# Patient Record
Sex: Female | Born: 1985 | Race: White | Hispanic: No | Marital: Married | Smoking: Never smoker
Health system: Southern US, Community
[De-identification: ages and names within clinical notes are randomized; demographics above are authoritative.]

## PROBLEM LIST (undated history)

## (undated) DIAGNOSIS — J302 Other seasonal allergic rhinitis: Secondary | ICD-10-CM

## (undated) HISTORY — DX: Other seasonal allergic rhinitis: J30.2

## (undated) HISTORY — PX: WISDOM TOOTH EXTRACTION: SHX21

---
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
## 2011-11-26 ENCOUNTER — Emergency Department (HOSPITAL_COMMUNITY)
Admission: EM | Admit: 2011-11-26 | Discharge: 2011-11-26 | Disposition: A | Discharge status: Home / Self Care | Payer: BC Managed Care – PPO | Attending: Emergency Medicine | Admitting: Emergency Medicine

## 2011-11-26 ENCOUNTER — Encounter (HOSPITAL_COMMUNITY): Payer: Self-pay | Admitting: *Deleted

## 2011-11-26 DIAGNOSIS — R109 Unspecified abdominal pain: Secondary | ICD-10-CM

## 2011-11-26 DIAGNOSIS — R1013 Epigastric pain: Secondary | ICD-10-CM | POA: Insufficient documentation

## 2011-11-26 LAB — URINE MICROSCOPIC-ADD ON

## 2011-11-26 LAB — URINALYSIS, ROUTINE W REFLEX MICROSCOPIC
Glucose, UA: NEGATIVE mg/dL
Ketones, ur: NEGATIVE mg/dL
Protein, ur: 30 mg/dL — AB
pH: 6.5 (ref 5.0–8.0)

## 2011-11-26 LAB — CBC
HCT: 40.1 % (ref 36.0–46.0)
Hemoglobin: 14 g/dL (ref 12.0–15.0)
MCV: 85.9 fL (ref 78.0–100.0)
WBC: 10.1 10*3/uL (ref 4.0–10.5)

## 2011-11-26 LAB — COMPREHENSIVE METABOLIC PANEL
AST: 18 U/L (ref 0–37)
Albumin: 4.3 g/dL (ref 3.5–5.2)
BUN: 12 mg/dL (ref 6–23)
Creatinine, Ser: 0.72 mg/dL (ref 0.50–1.10)
Potassium: 3.7 mEq/L (ref 3.5–5.1)
Total Protein: 7.8 g/dL (ref 6.0–8.3)

## 2011-11-26 LAB — DIFFERENTIAL
Lymphocytes Relative: 27 % (ref 12–46)
Lymphs Abs: 2.8 10*3/uL (ref 0.7–4.0)
Monocytes Absolute: 0.6 10*3/uL (ref 0.1–1.0)
Monocytes Relative: 6 % (ref 3–12)
Neutro Abs: 6.3 10*3/uL (ref 1.7–7.7)

## 2011-11-26 LAB — POCT PREGNANCY, URINE: Preg Test, Ur: NEGATIVE

## 2011-11-26 LAB — LIPASE, BLOOD: Lipase: 46 U/L (ref 11–59)

## 2011-11-26 MED ORDER — CIPROFLOXACIN HCL 250 MG PO TABS: 250.0000 mg | ORAL_TABLET | Freq: Two times a day (BID) | ORAL | Status: AC

## 2011-11-26 NOTE — ED Provider Notes (Signed)
I saw and evaluated the patient, reviewed the resident's note and I agree with the findings and plan.  Abd SNT had transient pain sent to ED for labs. Initial U/A could be contaminated so will cath.  Hurman Horn, MD 11/27/11 [phone]

## 2011-11-26 NOTE — ED Notes (Signed)
Patient states she is on her period.  She woke up today with cramping pain that wraps around her abd into her back.  Patient denies any changes in her period.

## 2011-11-26 NOTE — ED Notes (Signed)
Patient states on second day of menstrual cycle usually has cramping but never this bad.  Seen at urgent care all test negative.  Airway intact bilateral equal chest rise and fall steady gait. Denies dysuria

## 2011-11-26 NOTE — ED Provider Notes (Signed)
History     CSN: [id number]  Arrival date & time 11/26/11  1414   First MD Initiated Contact with Patient 11/26/11 1745      Chief Complaint  Patient presents with  . Abdominal Pain    (Consider location/radiation/quality/duration/timing/severity/associated sxs/prior treatment) Patient is a 26 y.o. female presenting with abdominal pain. The history is provided by the patient.  Abdominal Pain The primary symptoms of the illness include abdominal pain. The primary symptoms of the illness do not include fever, fatigue, shortness of breath, nausea, vomiting, diarrhea or vaginal discharge. The current episode started 13 to 24 hours ago. The onset of the illness was sudden. The problem has been resolved.  The abdominal pain is located in the epigastric region. The abdominal pain does not radiate. The abdominal pain is relieved by nothing. Exacerbated by: nothing.  Associated with: nothing. The patient states that she believes she is currently not pregnant. The patient has not had a change in bowel habit. Symptoms associated with the illness do not include chills, diaphoresis, hematuria or back pain. Associated medical issues comments: none.    History reviewed. No pertinent past medical history.  Past Surgical History  Procedure Date  . Wisdom tooth extraction     No family history on file.  History  Substance Use Topics  . Smoking status: Never Smoker   . Smokeless tobacco: Not on file  . Alcohol Use: No    OB History    Grav Para Term Preterm Abortions TAB SAB Ect Mult Living                  Review of Systems  Constitutional: Negative for fever, chills, diaphoresis and fatigue.  HENT: Negative for ear pain, congestion, sore throat, facial swelling, mouth sores, trouble swallowing, neck pain and neck stiffness.   Eyes: Negative.   Respiratory: Negative for apnea, cough, chest tightness, shortness of breath and wheezing.   Cardiovascular: Negative for chest pain,  palpitations and leg swelling.  Gastrointestinal: Positive for abdominal pain. Negative for nausea, vomiting, diarrhea and abdominal distention.  Genitourinary: Negative for hematuria, flank pain, vaginal discharge, difficulty urinating and menstrual problem.  Musculoskeletal: Negative for back pain and gait problem.  Skin: Negative for rash and wound.  Neurological: Negative for dizziness, tremors, seizures, syncope, facial asymmetry, numbness and headaches.  Psychiatric/Behavioral: Negative.   All other systems reviewed and are negative.    Allergies  Review of patient's allergies indicates no known allergies.  Home Medications   Current Outpatient Rx  Name Route Sig Dispense Refill  . MIDOL COMPLETE PO Oral Take 2 tablets by mouth every 6 (six) hours as needed. Pain      BP 117/81  Pulse 86  Temp 99 F (37.2 C) (Oral)  Resp 18  Ht 5\' 7"  (1.702 m)  Wt 237 lb (107.502 kg)  BMI 37.12 kg/m2  SpO2 99%  Physical Exam  Nursing note and vitals reviewed. Constitutional: She is oriented to person, place, and time. She appears well-developed and well-nourished. No distress.  HENT:  Head: Normocephalic and atraumatic.  Right Ear: External ear normal.  Left Ear: External ear normal.  Nose: Nose normal.  Mouth/Throat: Oropharynx is clear and moist. No oropharyngeal exudate.  Eyes: Conjunctivae and EOM are normal. Pupils are equal, round, and reactive to light. Right eye exhibits no discharge. Left eye exhibits no discharge.  Neck: Normal range of motion. Neck supple. No JVD present. No tracheal deviation present. No thyromegaly present.  Cardiovascular: Normal rate, regular rhythm,  normal heart sounds and intact distal pulses.  Exam reveals no gallop and no friction rub.   No murmur heard. Pulmonary/Chest: Effort normal and breath sounds normal. No respiratory distress. She has no wheezes. She has no rales. She exhibits no tenderness.  Abdominal: Soft. Bowel sounds are normal. She  exhibits no distension. There is no tenderness. There is no rebound and no guarding.  Genitourinary: Uterus normal. Uterus is not deviated and not tender. Cervix exhibits no motion tenderness, no discharge and no friability. Right adnexum displays no mass, no tenderness and no fullness. Left adnexum displays no mass, no tenderness and no fullness. There is bleeding around the vagina. No erythema or tenderness around the vagina. No foreign body around the vagina. No signs of injury around the vagina. No vaginal discharge found.  Musculoskeletal: Normal range of motion.  Lymphadenopathy:    She has no cervical adenopathy.  Neurological: She is alert and oriented to person, place, and time. No cranial nerve deficit. Coordination normal.  Skin: Skin is warm. No rash noted. She is not diaphoretic.  Psychiatric: She has a normal mood and affect. Her behavior is normal. Judgment and thought content normal.    ED Course  Procedures (including critical care time)  Labs Reviewed  URINALYSIS, ROUTINE W REFLEX MICROSCOPIC - Abnormal; Notable for the following:    Color, Urine RED (*)  BIOCHEMICALS MAY BE AFFECTED BY COLOR   APPearance CLOUDY (*)     Hgb urine dipstick LARGE (*)     Protein, ur 30 (*)     Leukocytes, UA MODERATE (*)     All other components within normal limits  URINE MICROSCOPIC-ADD ON - Abnormal; Notable for the following:    Squamous Epithelial / LPF FEW (*)     Bacteria, UA FEW (*)     All other components within normal limits  POCT PREGNANCY, URINE  COMPREHENSIVE METABOLIC PANEL  LIPASE, BLOOD  CBC  DIFFERENTIAL  URINALYSIS, ROUTINE W REFLEX MICROSCOPIC   No results found.   1. Abdominal pain       MDM  26 year old female patient with noncontributory past medical history comes in complaining of abdominal pain. Patient says that approximately 12 hours before presentation she was awoken from sleep with epigastric pain that lasted about 10 minutes and then resolved on  its own. Patient then went to urgent care clinic where they did urine testing and exam the patient and felt she was well and discharge her home. Patient had recurrence of the pain but was much less intense after going home. Patient was advised to come to the emergency department for examination. Here patient's abdomen is soft nontender nondistended no peritonitis no nausea vomiting no diarrhea no recent illnesses patient is on her period. Patient has been sexually active with with her husband exclusively for the past year. No vaginal symptoms. Normal bleeding of 6 days with regular cycles. Bimanual exam showed no cervical motion tenderness or adnexal tenderness there was blood on the glove but no discharge. Urinalysis with possible signs of infection patient was unable tolerate and out catheterization will treat empirically for cystitis but otherwise patient appears well-hydrated for discharge. Patient screening abdominal labs are negative.   Results for orders placed during the hospital encounter of 11/26/11  URINALYSIS, ROUTINE W REFLEX MICROSCOPIC      Component Value Range   Color, Urine RED (*) YELLOW   APPearance CLOUDY (*) CLEAR   Specific Gravity, Urine 1.016  1.005 - 1.030   pH 6.5  5.0 - 8.0   Glucose, UA NEGATIVE  NEGATIVE mg/dL   Hgb urine dipstick LARGE (*) NEGATIVE   Bilirubin Urine NEGATIVE  NEGATIVE   Ketones, ur NEGATIVE  NEGATIVE mg/dL   Protein, ur 30 (*) NEGATIVE mg/dL   Urobilinogen, UA 0.2  0.0 - 1.0 mg/dL   Nitrite NEGATIVE  NEGATIVE   Leukocytes, UA MODERATE (*) NEGATIVE  POCT PREGNANCY, URINE      Component Value Range   Preg Test, Ur NEGATIVE  NEGATIVE  URINE MICROSCOPIC-ADD ON      Component Value Range   Squamous Epithelial / LPF FEW (*) RARE   WBC, UA 11-20  <3 WBC/hpf   RBC / HPF TOO NUMEROUS TO COUNT  <3 RBC/hpf   Bacteria, UA FEW (*) RARE  COMPREHENSIVE METABOLIC PANEL      Component Value Range   Sodium 140  135 - 145 mEq/L   Potassium 3.7  3.5 - 5.1  mEq/L   Chloride 103  96 - 112 mEq/L   CO2 25  19 - 32 mEq/L   Glucose, Bld 89  70 - 99 mg/dL   BUN 12  6 - 23 mg/dL   Creatinine, Ser 4.54  0.50 - 1.10 mg/dL   Calcium 9.3  8.4 - 09.8 mg/dL   Total Protein 7.8  6.0 - 8.3 g/dL   Albumin 4.3  3.5 - 5.2 g/dL   AST 18  0 - 37 U/L   ALT 19  0 - 35 U/L   Alkaline Phosphatase 78  39 - 117 U/L   Total Bilirubin 0.3  0.3 - 1.2 mg/dL   GFR calc non Af Amer >90  >90 mL/min   GFR calc Af Amer >90  >90 mL/min  LIPASE, BLOOD      Component Value Range   Lipase 46  11 - 59 U/L  CBC      Component Value Range   WBC 10.1  4.0 - 10.5 K/uL   RBC 4.67  3.87 - 5.11 MIL/uL   Hemoglobin 14.0  12.0 - 15.0 g/dL   HCT 11.9  14.7 - 82.9 %   MCV 85.9  78.0 - 100.0 fL   MCH 30.0  26.0 - 34.0 pg   MCHC 34.9  30.0 - 36.0 g/dL   RDW 56.2  13.0 - 86.5 %   Platelets 268  150 - 400 K/uL  DIFFERENTIAL      Component Value Range   Neutrophils Relative 62  43 - 77 %   Neutro Abs 6.3  1.7 - 7.7 K/uL   Lymphocytes Relative 27  12 - 46 %   Lymphs Abs 2.8  0.7 - 4.0 K/uL   Monocytes Relative 6  3 - 12 %   Monocytes Absolute 0.6  0.1 - 1.0 K/uL   Eosinophils Relative 5  0 - 5 %   Eosinophils Absolute 0.5  0.0 - 0.7 K/uL   Basophils Relative 0  0 - 1 %   Basophils Absolute 0.0  0.0 - 0.1 K/uL     Is discussed with Dr. Burnett Corrente, MD 11/26/11 [phone]

## 2011-11-26 NOTE — ED Notes (Signed)
Received report from [employer]. Pt has been having lower abdominal pain that radiates to her back since 0400 this AM. Pt is on day 2 of her menstrual cycle. Pt stated that she normally has cramping but not this bad. Bleeding is at baseline. No n/v. Will continue to monitor.

## 2011-11-26 NOTE — ED Notes (Signed)
Attempted to to In and Out Cath x 3. pt and was unsuccessful. EDP made aware. Will continue to monitor.

## 2013-04-06 ENCOUNTER — Encounter: Payer: Self-pay | Admitting: Family Medicine

## 2013-05-11 ENCOUNTER — Encounter: Payer: Self-pay | Admitting: Family Medicine

## 2013-05-11 ENCOUNTER — Ambulatory Visit (INDEPENDENT_AMBULATORY_CARE_PROVIDER_SITE_OTHER): Payer: BC Managed Care – PPO | Admitting: Family Medicine

## 2013-05-11 VITALS — BP 122/92 | HR 73 | Temp 97.8°F | Resp 16 | Ht 67.0 in | Wt 249.8 lb

## 2013-05-11 DIAGNOSIS — E669 Obesity, unspecified: Secondary | ICD-10-CM | POA: Insufficient documentation

## 2013-05-11 DIAGNOSIS — Z01419 Encounter for gynecological examination (general) (routine) without abnormal findings: Secondary | ICD-10-CM

## 2013-05-11 DIAGNOSIS — Z8742 Personal history of other diseases of the female genital tract: Secondary | ICD-10-CM

## 2013-05-11 DIAGNOSIS — Z124 Encounter for screening for malignant neoplasm of cervix: Secondary | ICD-10-CM

## 2013-05-11 NOTE — Progress Notes (Signed)
S: This 27 y.o. Cauc married female is here for 1st PAP/pelvic exam. She was treated for yeast vaginitis a few weeks ago. The discharge has cleared; she denies pelvic pain or dysuria or abnormal menstrual bleeding. Cycle is every "4 weeks" and bleeding lasts ~6 days. Pt is sexually active but not interested in birth control. She and her husband are not trying to conceive.  PMHx, Surg Hx , Soc and Fam Hx reviewed.  ROS: As per HPI; otherwise unremarkable. Pt is aware that she needs to lose weight; she and her husband plan to address this issue.  O: Filed Vitals:   05/11/13 1343  BP: 122/92  Pulse: 73  Temp: 97.8 F (36.6 C)  Resp: 16   GEN: In NAD; WN,WD. HENT: [place]/AT; EOMI w/ clear conj/sclerae. Otherwise unremarkable. COR: RRR. LUNGS: Normal resp rate and effort. SKIN: W&D; intact w/o erythema or pallor. GU: NEFG w/ erythema and swelling of labia; vaginal vault w/ milky discharge and walls are erythematous. Cervix- nonparous w/ erythema; PAP obtained and bleeding from friable os noted. Bimanual- Midline uterus; no adnexal masses but mildly tender. NEURO: A&O x 3; CNs intact. Nonfocal.  A/P: Encounter for cervical Pap smear with pelvic exam - Plan: Pap IG, CT/NG w/ reflex HPV when ASC-U  H/O vaginal discharge

## 2013-05-14 NOTE — Progress Notes (Signed)
Quick Note:  Notify pt of Normal results. ______ 

## 2013-05-15 LAB — PAP IG, CT-NG, RFX HPV ASCU
Chlamydia Probe Amp: NEGATIVE
GC Probe Amp: NEGATIVE

## 2013-05-15 NOTE — Progress Notes (Signed)
Quick Note:  Notify pt of Normal results. ______ 

## 2014-03-05 ENCOUNTER — Other Ambulatory Visit: Payer: Self-pay | Admitting: Physician Assistant

## 2014-03-05 DIAGNOSIS — N63 Unspecified lump in unspecified breast: Secondary | ICD-10-CM

## 2014-03-07 ENCOUNTER — Other Ambulatory Visit: Payer: BC Managed Care – PPO

## 2014-03-11 ENCOUNTER — Ambulatory Visit
Admission: RE | Admit: 2014-03-11 | Discharge: 2014-03-11 | Disposition: A | Discharge status: Home / Self Care | Payer: BC Managed Care – PPO | Source: Ambulatory Visit | Attending: Physician Assistant | Admitting: Physician Assistant

## 2014-03-11 DIAGNOSIS — N63 Unspecified lump in unspecified breast: Secondary | ICD-10-CM

## 2015-10-24 ENCOUNTER — Ambulatory Visit (INDEPENDENT_AMBULATORY_CARE_PROVIDER_SITE_OTHER): Payer: BLUE CROSS/BLUE SHIELD | Admitting: Physician Assistant

## 2015-10-24 VITALS — BP 110/77 | HR 72 | Temp 98.0°F | Resp 16 | Ht 67.0 in | Wt 249.0 lb

## 2015-10-24 DIAGNOSIS — Z139 Encounter for screening, unspecified: Secondary | ICD-10-CM | POA: Diagnosis not present

## 2015-10-24 DIAGNOSIS — J069 Acute upper respiratory infection, unspecified: Secondary | ICD-10-CM | POA: Diagnosis not present

## 2015-10-24 LAB — POCT URINE PREGNANCY: Preg Test, Ur: NEGATIVE

## 2015-10-24 MED ORDER — HYDROCODONE-HOMATROPINE 5-1.5 MG/5ML PO SYRP: 2.5000 mL | ORAL_SOLUTION | Freq: Every evening | ORAL | Status: DC | PRN

## 2015-10-24 NOTE — Patient Instructions (Addendum)
Take 400-600 mg of motrin every 8 hour for pain and discomfort.  Take Zyrtec-D 5/120 in the morning and night for the next five days.  Use cough syrup as needed.     IF you received an x-ray today, you will receive an [facility] from [facility]. Please contact [facility] at [phone] with questions or concerns regarding your invoice.   IF you received labwork today, you will receive an invoice from [facility]. Please contact [facility] at [phone] with questions or concerns regarding your invoice.   Our billing staff will not be able to assist you with questions regarding bills from these companies.  You will be contacted with the lab results as soon as they are available. The fastest way to get your results is to activate your My Chart account. Instructions are located on the last page of this paperwork. If you have not heard from us regarding the results in 2 weeks, please contact this office.

## 2015-10-24 NOTE — Progress Notes (Signed)
   10/24/2015 10:03 AM   DOB: 02-01-1986 / MRN: [id number]  SUBJECTIVE:  Destiny Skinner is a 30 y.o. female presenting for nasal congestion and cough that started 5 days ago.  Associates sore throat near the beginning of the illness however this has resolved.  Has tried some of her husband's cough syrup with good relief.  She denies a history of asthma and quit smoking 3 months ago.  She smoked sporadically for about 1 year.   She has No Known Allergies.   She  has a past medical history of Seasonal allergies.    She  reports that she has never smoked. She has never used smokeless tobacco. She reports that she does not drink alcohol or use illicit drugs. She  has no sexual activity history on file. The patient  has past surgical history that includes Wisdom tooth extraction and Tonsillectomy (1993).  Her family history includes Cancer in her paternal grandfather; Diabetes in her mother and paternal grandmother; Heart disease in her maternal grandfather and maternal grandmother; Hyperlipidemia in her father and mother; Hypertension in her father and mother.  Review of Systems  Constitutional: Negative for fever and chills.  Respiratory: Positive for cough. Negative for hemoptysis, shortness of breath and wheezing.   Cardiovascular: Negative for chest pain.  Gastrointestinal: Negative for nausea and vomiting.  Skin: Negative for rash.  Neurological: Negative for headaches.    Problem list and medications reviewed and updated by myself where necessary, and exist elsewhere in the encounter.   OBJECTIVE:  BP 110/77 mmHg  Pulse 72  Temp(Src) 98 F (36.7 C) (Oral)  Resp 16  Ht 5\' 7"  (1.702 m)  Wt 249 lb (112.946 kg)  BMI 38.99 kg/m2  SpO2 97%  LMP 10/07/2015 (Approximate)  Physical Exam  Constitutional: She is oriented to person, place, and time.  HENT:  Right Ear: External ear normal.  Left Ear: External ear normal.  Nose: Mucosal edema present. Right sinus exhibits no maxillary  sinus tenderness and no frontal sinus tenderness. Left sinus exhibits no maxillary sinus tenderness and no frontal sinus tenderness.  Mouth/Throat: Oropharynx is clear and moist. No oropharyngeal exudate.  Eyes: Conjunctivae are normal. Pupils are equal, round, and reactive to light.  Cardiovascular: Regular rhythm and normal heart sounds.   Pulmonary/Chest: Effort normal and breath sounds normal. She has no rales.  Neurological: She is alert and oriented to person, place, and time.  Skin: Skin is warm and dry. No rash noted. She is not diaphoretic. No erythema.  Psychiatric: Her behavior is normal.    No results found for this or any previous visit (from the past 72 hour(s)).  No results found.  ASSESSMENT AND PLAN  Trula OreChristina was seen today for nasal congestion, cough and hoarse.  Diagnoses and all orders for this visit:  Acute URI: Most likely viral.  AVS for guidance and medication.  -     HYDROcodone-homatropine (HYCODAN) 5-1.5 MG/5ML syrup; Take 2.5-5 mLs by mouth at bedtime as needed.  Screening -     POCT urine pregnancy    The patient was advised to call or return to clinic if she does not see an improvement in symptoms or to seek the care of the closest emergency department if she worsens with the above plan.   Deliah BostonMichael Duyen Beckom, MHS, PA-C [facility] 10/24/2015 10:03 AM

## 2015-11-18 ENCOUNTER — Ambulatory Visit (INDEPENDENT_AMBULATORY_CARE_PROVIDER_SITE_OTHER): Payer: BLUE CROSS/BLUE SHIELD | Admitting: Urgent Care

## 2015-11-18 VITALS — BP 116/72 | HR 78 | Temp 98.4°F | Resp 18 | Ht 67.0 in | Wt 248.0 lb

## 2015-11-18 DIAGNOSIS — H9191 Unspecified hearing loss, right ear: Secondary | ICD-10-CM

## 2015-11-18 DIAGNOSIS — H6121 Impacted cerumen, right ear: Secondary | ICD-10-CM | POA: Diagnosis not present

## 2015-11-18 NOTE — Patient Instructions (Addendum)
Cerumen Impaction The structures of the external ear canal secrete a waxy substance known as cerumen. Excess cerumen can build up in the ear canal, causing a condition known as cerumen impaction. Cerumen impaction can cause ear pain and disrupt the function of the ear. The rate of cerumen production differs for each individual. In certain individuals, the configuration of the ear canal may decrease his or her ability to naturally remove cerumen. CAUSES Cerumen impaction is caused by excessive cerumen production or buildup. RISK FACTORS  Frequent use of swabs to clean ears.  Having narrow ear canals.  Having eczema.  Being dehydrated. SIGNS AND SYMPTOMS  Diminished hearing.  Ear drainage.  Ear pain.  Ear itch. TREATMENT Treatment may involve:  Over-the-counter or prescription ear drops to soften the cerumen.  Removal of cerumen by a health care provider. This may be done with:  Irrigation with warm water. This is the most common method of removal.  Ear curettes and other instruments.  Surgery. This may be done in severe cases. HOME CARE INSTRUCTIONS  Take medicines only as directed by your health care provider.  Do not insert objects into the ear with the intent of cleaning the ear. PREVENTION  Do not insert objects into the ear, even with the intent of cleaning the ear. Removing cerumen as a part of normal hygiene is not necessary, and the use of swabs in the ear canal is not recommended.  Drink enough water to keep your urine clear or pale yellow.  Control your eczema if you have it. SEEK MEDICAL CARE IF:  You develop ear pain.  You develop bleeding from the ear.  The cerumen does not clear after you use ear drops as directed.   This information is not intended to replace advice given to you by your health care provider. Make sure you discuss any questions you have with your health care provider.   Document Released: 06/17/2004 Document Revised: 05/31/2014  Document Reviewed: 12/25/2014 Elsevier Interactive Patient Education 2016 [employer]     IF you received an x-ray today, you will receive an invoice from [facility]. Please contact [facility] at [phone] with questions or concerns regarding your invoice.   IF you received labwork today, you will receive an invoice from [facility]. Please contact [facility] at [phone] with questions or concerns regarding your invoice.   Our billing staff will not be able to assist you with questions regarding bills from these companies.  You will be contacted with the lab results as soon as they are available. The fastest way to get your results is to activate your My Chart account. Instructions are located on the last page of this paperwork. If you have not heard from us regarding the results in 2 weeks, please contact this office.      

## 2015-11-18 NOTE — Progress Notes (Signed)
    MRN: [id number] DOB: Jan 03, 1986  Subjective:   Destiny Skinner is a 30 y.o. female presenting for chief complaint of Otitis Externa  Reports ~1 week history of decreased hearing in right ear. Problem started after she water in her right ear while showering. She attempted to use a Q-tip to get the water out and clean and since has had decreased hearing. She also tried otc cerumenolytic. Admits history of cerumen impaction requiring ear lavage. Denies fever, drainage, tinnitus, pain.   Destiny Skinner has a current medication list which includes the following prescription(s): multivitamin and hydrocodone-homatropine. Also has No Known Allergies.  Destiny Skinner  has a past medical history of Seasonal allergies. Also  has past surgical history that includes Wisdom tooth extraction and Tonsillectomy (1993).  Objective:   Vitals: BP 116/72 mmHg  Pulse 78  Temp(Src) 98.4 F (36.9 C) (Oral)  Resp 18  Ht 5\' 7"  (1.702 m)  Wt 248 lb (112.492 kg)  BMI 38.83 kg/m2  SpO2 98%  LMP 10/31/2015  Physical Exam  Constitutional: She is oriented to person, place, and time. She appears well-developed and well-nourished.  HENT:  Right TM cerumen impacted, both intact bilaterally, no effusions or erythema. Nasal turbinates pink and moist, without sinus tenderness. Throat without oropharyngeal exudates, erythema or abscesses.  Cardiovascular: Normal rate.   Pulmonary/Chest: Effort normal.  Neurological: She is alert and oriented to person, place, and time.  Skin: Skin is warm and dry.   Assessment and Plan :   1. Cerumen impaction, right 2. Decreased hearing, right - Anticipatory guidance provided, rtc as needed.  Wallis BambergMario Shaya Altamura, PA-C [facility] [phone] 11/18/2015 11:54 AM

## 2016-04-23 ENCOUNTER — Ambulatory Visit (INDEPENDENT_AMBULATORY_CARE_PROVIDER_SITE_OTHER): Payer: BLUE CROSS/BLUE SHIELD

## 2016-04-23 ENCOUNTER — Ambulatory Visit (INDEPENDENT_AMBULATORY_CARE_PROVIDER_SITE_OTHER): Payer: BLUE CROSS/BLUE SHIELD | Admitting: Emergency Medicine

## 2016-04-23 VITALS — BP 130/90 | HR 80 | Temp 99.1°F | Resp 16 | Ht 67.0 in | Wt 246.4 lb

## 2016-04-23 DIAGNOSIS — M542 Cervicalgia: Secondary | ICD-10-CM

## 2016-04-23 DIAGNOSIS — N912 Amenorrhea, unspecified: Secondary | ICD-10-CM

## 2016-04-23 LAB — POCT URINE PREGNANCY: Preg Test, Ur: NEGATIVE

## 2016-04-23 MED ORDER — MELOXICAM 7.5 MG PO TABS: ORAL_TABLET | ORAL | 0 refills | Status: DC

## 2016-04-23 MED ORDER — CYCLOBENZAPRINE HCL 5 MG PO TABS: 5.0000 mg | ORAL_TABLET | Freq: Three times a day (TID) | ORAL | 1 refills | Status: DC | PRN

## 2016-04-23 NOTE — Progress Notes (Addendum)
Patient ID: Destiny Skinner, female   DOB: 1986/02/22, 30 y.o.   MRN: [id number]    By signing my name below, I, Essence Howell, attest that this documentation has been prepared under the direction and in the presence of Collene GobbleSteven A Marquavius Scaife, MD Electronically Signed: Charline BillsEssence Howell, ED Scribe 04/23/2016 at 3:41 PM.  Chief Complaint:  Chief Complaint  Patient presents with  . Neck Pain    x 2 days -NKI-   HPI: Destiny Skinner is a 30 y.o. female who [facility] complaining of gradually worsening left-sided neck pain for the past 2 days. Pt repots increased neck pain with rotating her head. She went to work earlier today but states that pain was so severe that she had to leave early. 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 Pt denies weakness or numbness radiating into her upper extremities. Pt's LNMP was approximately 04/01/16; she is not currently on birth control.   Past Medical History:  Diagnosis Date  . Seasonal allergies    Past Surgical History:  Procedure Laterality Date  . TONSILLECTOMY  1993  . WISDOM TOOTH EXTRACTION     Social History   Social History  . Marital status: Married    Spouse name: N/A  . Number of children: N/A  . Years of education: N/A   Social History Main Topics  . Smoking status: Never Smoker  . Smokeless tobacco: Never Used  . Alcohol use No  . Drug use: No  . Sexual activity: Not Asked   Other Topics Concern  . None   Social History Narrative  . None   Family History  Problem Relation Age of Onset  . Diabetes Mother   . Hyperlipidemia Mother   . Hypertension Mother   . Hyperlipidemia Father   . Hypertension Father   . Heart disease Maternal Grandmother   . Heart disease Maternal Grandfather   . Diabetes Paternal Grandmother   . Cancer Paternal Grandfather    No Known Allergies Prior to Admission medications   Medication Sig Start  Date End Date Taking? Authorizing Provider  Multiple Vitamin (MULTIVITAMIN) tablet Take 1 tablet by mouth daily.    Historical Provider, MD   ROS: The patient denies fevers, chills, night sweats, unintentional weight loss, chest pain, palpitations, wheezing, dyspnea on exertion, nausea, vomiting, abdominal pain, dysuria, hematuria, melena, numbness, weakness, or tingling. +neck pain  All other systems have been reviewed and were otherwise negative with the exception of those mentioned in the HPI and as above.    PHYSICAL EXAM: Vitals:   04/23/16 1507  BP: 130/90  Pulse: 80  Resp: 16  Temp: 99.1 F (37.3 C)   Body mass index is 38.59 kg/m.  General: Alert, no acute distress HEENT:  Normocephalic, atraumatic, oropharynx patent. Eye: Nonie HoyerOMI, [facility] Cardiovascular:  Regular rate and rhythm, no rubs murmurs or gallops. No Carotid bruits, radial pulse intact. No pedal edema.  Respiratory: Clear to auscultation bilaterally. No wheezes, rales, or rhonchi. No cyanosis, no use of accessory musculature Abdominal: No organomegaly, abdomen is soft and non-tender, positive bowel sounds. No masses. Musculoskeletal: Gait intact. No edema. Tender to the L side of the neck. Pain with twisting to the R. Motor strength 5/5. Reflexes are equal and symmetrical.  Skin: No rashes. Neurologic: Facial musculature symmetric. Psychiatric: Patient acts appropriately throughout our interaction. Lymphatic: No cervical or submandibular lymphadenopathy  LABS Results for  orders placed or performed in visit on 04/23/16  POCT urine pregnancy  Result Value Ref Range   Preg Test, Ur Negative Negative    : .EKG/Xray Primary read interpreted by [facility] Cle[facility]aub [facility] Dg Cervical Spine 2 Or 3 Views  Result Date: 04/23/2016 CLINICAL DATA:  Acute cervicalgia EXAM: CERVICAL SPINE - 2-3 VIEW COMPARISON:  None. FINDINGS: Frontal, lateral, and open-mouth odontoid images were obtained. There is no fracture or spondylolisthesis.  Prevertebral soft tissues and predental space regions are normal. The disc spaces appear normal. No erosive change. IMPRESSION: No fracture or spondylolisthesis.  No evident arthropathy. Electronically Signed   By: Bretta BangWilliam  Woodruff III M.D.   On: 04/23/2016 16:49    ASSESSMENT/PLAN: Patient placed out of work. She will alternate ice and heat to her neck. She will be on meloxicam 7.5 one to 2 tablets daily along with Flexeril.I personally performed the services described in this documentation, which was scribed in my presence. The recorded information has been reviewed and is accurate.    Gross sideeffects, risk and benefits, and alternatives of medications d/w patient. Patient is aware that all medications have potential sideeffects and we are unable to predict every sideeffect or drug-drug interaction that may occur.  Lesle ChrisSteven Cinzia Devos MD 04/23/2016 3:41 PM

## 2016-04-23 NOTE — Patient Instructions (Addendum)
IF you received an x-ray today, you will receive an invoice from [facility]. Please contact [facility] at [phone] with questions or concerns regarding your invoice.   IF you received labwork today, you will receive an invoice from [facility]. Please contact [facility] at [phone] with questions or concerns regarding your invoice.   Our billing staff will not be able to assist you with questions regarding bills from these companies.  You will be contacted with the lab results as soon as they are available. The fastest way to get your results is to activate your My Chart account. Instructions are located on the last page of this paperwork. If you have not heard from us regarding the results in 2 weeks, please contact this office.     Cervical strain Cervical Sprain A cervical sprain is a stretch or tear in the tissues that connect bones (ligaments) in the neck. Most neck (cervical) sprains get better in 4-6 weeks. Follow these instructions at home: If you have a neck collar:  Wear it as told by your doctor. Do not take off (do not remove) the collar unless your doctor says that this is safe.  Ask your doctor before adjusting your collar.  If you have long hair, keep it outside of the collar.  Ask your doctor if you may take off the collar for cleaning and bathing. If you may take off the collar:  Follow instructions from your doctor about how to take off the collar safely.  Clean the collar by wiping it with mild soap and water. Let it air-dry all the way.  If your collar has removable pads:  Take the pads out every 1-2 days.  Hand wash the pads with soap and water.  Let the pads air-dry all the way before you put them back in the collar. Do not dry them in a clothes dryer. Do not dry them with a hair dryer.  Check your skin under the collar for irritation or sores. If you see any, tell your doctor. Managing pain,  stiffness, and swelling  Use a cervical traction device, if told by your doctor.  If told, put heat on the affected area. Do this before exercises (physical therapy) or as often as told by your doctor. Use the heat source that your doctor recommends, such as a moist heat pack or a heating pad.  Place a towel between your skin and the heat source.  Leave the heat on for 20-30 minutes.  Take the heat off (remove the heat) if your skin turns bright red. This is very important if you cannot feel pain, heat, or cold. You may have a greater risk of getting burned.  Put ice on the affected area.  Put ice in a plastic bag.  Place a towel between your skin and the bag.  Leave the ice on for 20 minutes, 2-3 times a day. Activity  Do not drive while wearing a neck collar. If you do not have a neck collar, ask your doctor if it is safe to drive.  Do not drive or use heavy machinery while taking prescription pain medicine or muscle relaxants, unless your doctor approves.  Do not lift anything that is heavier than 10 lb (4.5 kg) until your doctor tells you that it is safe.  Rest as told by your doctor.  Avoid activities that make you feel worse. Ask your doctor what activities are safe for you.  Do exercises as told by  your doctor or physical therapist. Preventing neck sprain  Practice good posture. Adjust your workstation to help with this, if needed.  Exercise regularly as told by your doctor or physical therapist.  Avoid activities that are risky or may cause a neck sprain (cervical sprain). General instructions  Take over-the-counter and prescription medicines only as told by your doctor.  Do not use any products that contain nicotine or tobacco. This includes cigarettes and e-cigarettes. If you need help quitting, ask your doctor.  Keep all follow-up visits as told by your doctor. This is important. Contact a doctor if:  You have pain or other symptoms that get worse.  You  have symptoms that do not get better after 2 weeks.  You have pain that does not get better with medicine.  You start to have new, unexplained symptoms.  You have sores or irritated skin from wearing your neck collar. Get help right away if:  You have very bad pain.  You have any of the following in any part of your body:  Loss of feeling (numbness).  Tingling.  Weakness.  You cannot move a part of your body (you have paralysis).  Your activity level does not improve. Summary  A cervical sprain is a stretch or tear in the tissues that connect bones (ligaments) in the neck.  If you have a neck (cervical) collar, do not take off the collar unless your doctor says that this is safe.  Put ice on affected areas as told by your doctor.  Put heat on affected areas as told by your doctor.  Good posture and regular exercise can help prevent a neck sprain from happening again. This information is not intended to replace advice given to you by your health care provider. Make sure you discuss any questions you have with your health care provider. Document Released: 10/27/2007 Document Revised: 01/20/2016 Document Reviewed: 01/20/2016 Elsevier Interactive Patient Education  2017 [employer]

## 2016-05-22 ENCOUNTER — Ambulatory Visit (INDEPENDENT_AMBULATORY_CARE_PROVIDER_SITE_OTHER): Payer: BLUE CROSS/BLUE SHIELD | Admitting: Physician Assistant

## 2016-05-22 ENCOUNTER — Encounter: Payer: Self-pay | Admitting: Physician Assistant

## 2016-05-22 VITALS — BP 122/72 | HR 84 | Temp 98.4°F | Resp 17 | Ht 67.5 in | Wt 245.0 lb

## 2016-05-22 DIAGNOSIS — J069 Acute upper respiratory infection, unspecified: Secondary | ICD-10-CM

## 2016-05-22 DIAGNOSIS — B9789 Other viral agents as the cause of diseases classified elsewhere: Secondary | ICD-10-CM | POA: Diagnosis not present

## 2016-05-22 MED ORDER — GUAIFENESIN ER 1200 MG PO TB12: 1.0000 | ORAL_TABLET | Freq: Two times a day (BID) | ORAL | 1 refills | Status: DC | PRN

## 2016-05-22 MED ORDER — BENZONATATE 100 MG PO CAPS: 100.0000 mg | ORAL_CAPSULE | Freq: Three times a day (TID) | ORAL | 0 refills | Status: DC | PRN

## 2016-05-22 MED ORDER — AZELASTINE HCL 0.15 % NA SOLN: 2.0000 | Freq: Two times a day (BID) | NASAL | 0 refills | Status: DC

## 2016-05-22 NOTE — Progress Notes (Signed)
Patient ID: Thelma BargeChristina Skinner, female    DOB: May 02, 1986, 30 y.o.   MRN: [id number]  PCP: Default, Provider, MD  Chief Complaint  Patient presents with  . URI  . Shortness of Breath    Subjective:   Presents for evaluation of respiratory illness.  Stuffy, runny nose. Sore throat. Chest soreness with cough. Headache with cough.  Accompanied by her husband. He was seen here on 05/18/2016. He had similar symptoms last week, but is significantly improved.  Her symptoms began Tuesday 12/26 or Wednesday. Yesterday she was using cough drops continuously for sore throat and cough. No fever/chills. No nausea/vomiting, though gags with coughing. No myalgias/arthralgias.  Review of Systems As above.    Patient Active Problem List   Diagnosis Date Noted  . Obesity, unspecified 05/11/2013     Prior to Admission medications   Not on File     No Known Allergies     Objective:  Physical Exam  Constitutional: She is oriented to person, place, and time. She appears well-developed and well-nourished. No distress.  BP 122/72 (BP Location: Right Arm, Patient Position: Sitting, Cuff Size: Normal)   Pulse 84   Temp 98.4 F (36.9 C) (Oral)   Resp 17   Ht 5' 7.5" (1.715 m)   Wt 245 lb (111.1 kg)   SpO2 99%   BMI 37.81 kg/m    HENT:  Head: Normocephalic and atraumatic.  Right Ear: Hearing, tympanic membrane, external ear and ear canal normal.  Left Ear: Hearing, tympanic membrane, external ear and ear canal normal.  Nose: Mucosal edema and rhinorrhea present.  No foreign bodies. Right sinus exhibits no maxillary sinus tenderness and no frontal sinus tenderness. Left sinus exhibits no maxillary sinus tenderness and no frontal sinus tenderness.  Mouth/Throat: Uvula is midline, oropharynx is clear and moist and mucous membranes are normal. No uvula swelling. No oropharyngeal exudate.  Eyes: Conjunctivae and EOM are normal. Pupils are equal, round, and reactive to light. Right  eye exhibits no discharge. Left eye exhibits no discharge. No scleral icterus.  Neck: Trachea normal, normal range of motion and full passive range of motion without pain. Neck supple. No thyroid mass and no thyromegaly present.  Cardiovascular: Normal rate, regular rhythm and normal heart sounds.   Pulmonary/Chest: Effort normal and breath sounds normal.  Lymphadenopathy:       Head (right side): No submandibular, no tonsillar, no preauricular, no posterior auricular and no occipital adenopathy present.       Head (left side): No submandibular, no tonsillar, no preauricular and no occipital adenopathy present.    She has no cervical adenopathy.       Right: No supraclavicular adenopathy present.       Left: No supraclavicular adenopathy present.  Neurological: She is alert and oriented to person, place, and time. She has normal strength. No cranial nerve deficit or sensory deficit.  Skin: Skin is warm, dry and intact. No rash noted.  Psychiatric: She has a normal mood and affect. Her speech is normal and behavior is normal.           Assessment & Plan:   1. Viral URI with cough Supportive care.  Anticipatory guidance.  RTC if symptoms worsen/persist. - Guaifenesin (MUCINEX MAXIMUM STRENGTH) 1200 MG TB12; Take 1 tablet (1,200 mg total) by mouth every 12 (twelve) hours as needed.  Dispense: 14 tablet; Refill: 1 - benzonatate (TESSALON) 100 MG capsule; Take 1-2 capsules (100-200 mg total) by mouth 3 (three) times daily as needed for  cough.  Dispense: 40 capsule; Refill: 0 - Azelastine HCl 0.15 % SOLN; Place 2 sprays into both nostrils 2 (two) times daily.  Dispense: 30 mL; Refill: 0   Fernande Brashelle S. Jezreel Justiniano, PA-C Physician Assistant-Certified [facility]

## 2016-05-22 NOTE — Patient Instructions (Addendum)
Get plenty of rest and drink at least 64 ounces of water daily.    IF you received an x-ray today, you will receive an invoice from [facility]. Please contact [facility] at [phone] with questions or concerns regarding your invoice.   IF you received labwork today, you will receive an invoice from [facility]. Please contact [facility] at [phone] with questions or concerns regarding your invoice.   Our billing staff will not be able to assist you with questions regarding bills from these companies.  You will be contacted with the lab results as soon as they are available. The fastest way to get your results is to activate your My Chart account. Instructions are located on the last page of this paperwork. If you have not heard from us regarding the results in 2 weeks, please contact this office.      

## 2017-08-19 ENCOUNTER — Telehealth: Payer: Self-pay | Admitting: Physician Assistant

## 2017-08-19 DIAGNOSIS — Z20828 Contact with and (suspected) exposure to other viral communicable diseases: Secondary | ICD-10-CM

## 2017-08-19 MED ORDER — OSELTAMIVIR PHOSPHATE 75 MG PO CAPS
75.0000 mg | ORAL_CAPSULE | Freq: Every day | ORAL | 0 refills | Status: DC
Start: 2017-08-19 — End: 2017-09-22

## 2017-08-19 NOTE — Telephone Encounter (Signed)
Husband with influenza

## 2017-09-22 ENCOUNTER — Ambulatory Visit: Payer: BLUE CROSS/BLUE SHIELD | Admitting: Family Medicine

## 2017-09-22 ENCOUNTER — Encounter: Payer: Self-pay | Admitting: Family Medicine

## 2017-09-22 ENCOUNTER — Other Ambulatory Visit: Payer: Self-pay

## 2017-09-22 VITALS — BP 130/82 | HR 97 | Temp 98.9°F | Resp 16 | Ht 67.5 in | Wt 254.6 lb

## 2017-09-22 DIAGNOSIS — R0982 Postnasal drip: Secondary | ICD-10-CM | POA: Diagnosis not present

## 2017-09-22 DIAGNOSIS — J301 Allergic rhinitis due to pollen: Secondary | ICD-10-CM | POA: Diagnosis not present

## 2017-09-22 DIAGNOSIS — R05 Cough: Secondary | ICD-10-CM

## 2017-09-22 DIAGNOSIS — R519 Headache, unspecified: Secondary | ICD-10-CM

## 2017-09-22 DIAGNOSIS — R059 Cough, unspecified: Secondary | ICD-10-CM

## 2017-09-22 DIAGNOSIS — R51 Headache: Secondary | ICD-10-CM

## 2017-09-22 MED ORDER — FLUTICASONE PROPIONATE 50 MCG/ACT NA SUSP
2.0000 | Freq: Every day | NASAL | 6 refills | Status: DC
Start: 2017-09-22 — End: 2023-05-11

## 2017-09-22 NOTE — Patient Instructions (Addendum)
For 1 week increase the flonase to twice a day then after the week decrease to once a day.     IF you received an x-ray today, you will receive an invoice from [facility]. Please contact [facility] at [phone] with questions or concerns regarding your invoice.   IF you received labwork today, you will receive an invoice from [place]. Please contact [facility] at [phone] with questions or concerns regarding your invoice.   Our billing staff will not be able to assist you with questions regarding bills from these companies.  You will be contacted with the lab results as soon as they are available. The fastest way to get your results is to activate your My Chart account. Instructions are located on the last page of this paperwork. If you have not heard from [place] regarding the results in 2 weeks, please contact this office.    Allergic Rhinitis, Adult Allergic rhinitis is an allergic reaction that affects the mucous membrane inside the nose. It causes sneezing, a runny or stuffy nose, and the feeling of mucus going down the back of the throat (postnasal drip). Allergic rhinitis can be mild to severe. There are two types of allergic rhinitis:  Seasonal. This type is also called hay fever. It happens only during certain seasons.  Perennial. This type can happen at any time of the year.  What are the causes? This condition happens when the body's defense system (immune system) responds to certain harmless substances called allergens as though they were germs.  Seasonal allergic rhinitis is triggered by pollen, which can come from grasses, trees, and weeds. Perennial allergic rhinitis may be caused by:  House dust mites.  Pet dander.  Mold spores.  What are the signs or symptoms? Symptoms of this condition include:  Sneezing.  Runny or stuffy nose (nasal congestion).  Postnasal drip.  Itchy nose.  Tearing of the eyes.  Trouble sleeping.  Daytime  sleepiness.  How is this diagnosed? This condition may be diagnosed based on:  Your medical history.  A physical exam.  Tests to check for related conditions, such as: ? Asthma. ? Pink eye. ? Ear infection. ? Upper respiratory infection.  Tests to find out which allergens trigger your symptoms. These may include skin or blood tests.  How is this treated? There is no cure for this condition, but treatment can help control symptoms. Treatment may include:  Taking medicines that block allergy symptoms, such as antihistamines. Medicine may be given as a shot, nasal spray, or pill.  Avoiding the allergen.  Desensitization. This treatment involves getting ongoing shots until your body becomes less sensitive to the allergen. This treatment may be done if other treatments do not help.  If taking medicine and avoiding the allergen does not work, new, stronger medicines may be prescribed.  Follow these instructions at home:  Find out what you are allergic to. Common allergens include smoke, dust, and pollen.  Avoid the things you are allergic to. These are some things you can do to help avoid allergens: ? Replace carpet with wood, tile, or vinyl flooring. Carpet can trap dander and dust. ? Do not smoke. Do not allow smoking in your home. ? Change your heating and air conditioning filter at least once a month. ? During allergy season:  Keep windows closed as much as possible.  Plan outdoor activities when pollen counts are lowest. This is usually during the evening hours.  When coming indoors, change clothing and shower before sitting on  furniture or bedding.  Take over-the-counter and prescription medicines only as told by your health care provider.  Keep all follow-up visits as told by your health care provider. This is important. Contact a health care provider if:  You have a fever.  You develop a persistent cough.  You make whistling sounds when you breathe (you  wheeze).  Your symptoms interfere with your normal daily activities. Get help right away if:  You have shortness of breath. Summary  This condition can be managed by taking medicines as directed and avoiding allergens.  Contact your health care provider if you develop a persistent cough or fever.  During allergy season, keep windows closed as much as possible. This information is not intended to replace advice given to you by your health care provider. Make sure you discuss any questions you have with your health care provider. Document Released: 02/02/2001 Document Revised: 06/17/2016 Document Reviewed: 06/17/2016 Elsevier Interactive Patient Education  [employer].

## 2017-09-22 NOTE — Progress Notes (Signed)
Chief Complaint  Patient presents with  . Allergies    stuffy nose, rn , dry cough with yellow mucus w/blood in it, ha, head congestion, no chest congestion, ears pop when she blows her nose and clearing her throat clears up clogged ears, no fevers.  Onset: x 1  week +    HPI  She reports that for a week she has been having runny nose, sinus headache, ear fullness, postnasal drip, clogged ears, dry cough but this morning she coughed up some mucus She reports a 34 year history of allergic rhinitis  She is taking zyrtec and a saline sinus rinse She has no history of asthma    Past Medical History:  Diagnosis Date  . Seasonal allergies     Current Outpatient Medications  Medication Sig Dispense Refill  . Azelastine HCl 0.15 % SOLN Place 2 sprays into both nostrils 2 (two) times daily. 30 mL 0  . cetirizine (ZYRTEC) 10 MG tablet Take 10 mg by mouth daily.    . fluticasone (FLONASE) 50 MCG/ACT nasal spray Place 2 sprays into both nostrils daily. 16 g 6   No current facility-administered medications for this visit.     Allergies: No Known Allergies  Past Surgical History:  Procedure Laterality Date  . TONSILLECTOMY  1993  . WISDOM TOOTH EXTRACTION      Social History   Socioeconomic History  . Marital status: Married    Spouse name: Not on file  . Number of children: Not on file  . Years of education: Not on file  . Highest education level: Not on file  Occupational History  . Not on file  Social Needs  . Financial resource strain: Not on file  . Food insecurity:    Worry: Not on file    Inability: Not on file  . Transportation needs:    Medical: Not on file    Non-medical: Not on file  Tobacco Use  . Smoking status: Never Smoker  . Smokeless tobacco: Never Used  Substance and Sexual Activity  . Alcohol use: No    Alcohol/week: 0.0 oz  . Drug use: No  . Sexual activity: Never  Lifestyle  . Physical activity:    Days per week: Not on file    Minutes per  session: Not on file  . Stress: Not on file  Relationships  . Social connections:    Talks on phone: Not on file    Gets together: Not on file    Attends religious service: Not on file    Active member of club or organization: Not on file    Attends meetings of clubs or organizations: Not on file    Relationship status: Not on file  Other Topics Concern  . Not on file  Social History Narrative  . Not on file    Family History  Problem Relation Age of Onset  . Diabetes Mother   . Hyperlipidemia Mother   . Hypertension Mother   . Hyperlipidemia Father   . Hypertension Father   . Heart disease Maternal Grandmother   . Heart disease Maternal Grandfather   . Diabetes Paternal Grandmother   . Cancer Paternal Grandfather      ROS Review of Systems See HPI Constitution: No fevers or chills No malaise No diaphoresis Skin: No rash or itching Eyes: no blurry vision, no double vision GU: no dysuria or hematuria Neuro: no dizziness or headaches  all others reviewed and negative   Objective: Vitals:   09/22/17  1503  BP: 130/82  Pulse: 97  Resp: 16  Temp: 98.9 F (37.2 C)  TempSrc: Oral  SpO2: 97%  Weight: 254 lb 9.6 oz (115.5 kg)  Height: 5' 7.5" (1.715 m)    Physical Exam General: alert, oriented, in NAD Head: normocephalic, atraumatic, +frontal sinus tenderness Eyes: EOM intact, no scleral icterus or conjunctival injection Ears: TM clear bilaterally, +membranes bulging Nose: mucosa nonerythematous, nonedematous Throat: no pharyngeal exudate or erythema Lymph: no posterior auricular, submental or cervical lymph adenopathy Heart: normal rate, normal sinus rhythm, no murmurs Lungs: clear to auscultation bilaterally, no wheezing   Assessment and Plan Preethi was seen today for allergies.  Diagnoses and all orders for this visit:  Seasonal allergic rhinitis due to pollen -     fluticasone (FLONASE) 50 MCG/ACT nasal spray; Place 2 sprays into both nostrils  daily.  Sinus headache Postnasal drip Cough  advised mucinex flonase bid for one week then decrease to once a day Continue zyrtec Nasal rinse    Osha Rane A Creta Levin

## 2017-10-10 ENCOUNTER — Ambulatory Visit: Payer: BLUE CROSS/BLUE SHIELD | Admitting: Physician Assistant

## 2017-10-10 ENCOUNTER — Encounter: Payer: Self-pay | Admitting: Physician Assistant

## 2017-10-10 VITALS — BP 120/86 | HR 80 | Temp 98.6°F | Resp 16 | Ht 67.5 in | Wt 253.2 lb

## 2017-10-10 DIAGNOSIS — Z9109 Other allergy status, other than to drugs and biological substances: Secondary | ICD-10-CM | POA: Diagnosis not present

## 2017-10-10 DIAGNOSIS — H66002 Acute suppurative otitis media without spontaneous rupture of ear drum, left ear: Secondary | ICD-10-CM | POA: Diagnosis not present

## 2017-10-10 MED ORDER — AMOXICILLIN-POT CLAVULANATE 875-125 MG PO TABS: 1.0000 | ORAL_TABLET | Freq: Two times a day (BID) | ORAL | 0 refills | Status: AC

## 2017-10-10 MED ORDER — AZELASTINE HCL 0.15 % NA SOLN: 2.0000 | Freq: Two times a day (BID) | NASAL | 0 refills | Status: DC

## 2017-10-10 NOTE — Patient Instructions (Addendum)
1. Use the azelastine twice daily, until your symptoms are resolved. You can restart it whenever you need it. 2. Continue the Flonase, 2 sprays in each nostril once each day (preferably 5-10 minutes after a dose of the azelastine, if you are using that). 3. Continue the cetirizine (Zyrtec). 4. I do not recommend ear plugs, but a mask may help to reduce the amount of irritating particles that you inhale while working outside.    IF you received an x-ray today, you will receive an invoice from [facility]. Please contact [facility] at [phone] with questions or concerns regarding your invoice.   IF you received labwork today, you will receive an invoice from [place]. Please contact [facility] at [phone] with questions or concerns regarding your invoice.   Our billing staff will not be able to assist you with questions regarding bills from these companies.  You will be contacted with the lab results as soon as they are available. The fastest way to get your results is to activate your My Chart account. Instructions are located on the last page of this paperwork. If you have not heard from [place] regarding the results in 2 weeks, please contact this office.      Otitis Media, Adult Otitis media is redness, soreness, and puffiness (swelling) in the space just behind your eardrum (middle ear). It may be caused by allergies or infection. It often happens along with a cold. Follow these instructions at home:  Take your medicine as told. Finish it even if you start to feel better.  Only take over-the-counter or prescription medicines for pain, discomfort, or fever as told by your doctor.  Follow up with your doctor as told. Contact a doctor if:  You have otitis media only in one ear, or bleeding from your nose, or both.  You notice a lump on your neck.  You are not getting better in 3-5 days.  You feel worse instead of better. Get help right away if:  You have pain  that is not helped with medicine.  You have puffiness, redness, or pain around your ear.  You get a stiff neck.  You cannot move part of your face (paralysis).  You notice that the bone behind your ear hurts when you touch it. This information is not intended to replace advice given to you by your health care provider. Make sure you discuss any questions you have with your health care provider. Document Released: 10/27/2007 Document Revised: 10/16/2015 Document Reviewed: 12/05/2012 Elsevier Interactive Patient Education  2017 [employer].

## 2017-10-10 NOTE — Progress Notes (Signed)
Patient ID: Destiny Skinner, female    DOB: 09/15/1985, 32 y.o.   MRN: [id number]  PCP: Default, Provider, MD  Chief Complaint  Patient presents with  . Ear Pain    started yesterday in Left ear, hard to hear    Subjective:   Presents for evaluation of left ear pain and reduced hearing on the left.  She was seen here by one of my colleagues on 09/22/2017 for URI-type symptoms. She was diagnosed with seasonal allergy symptoms, and prescribed Flonase nasal spray.  No fever or chills. No headache. Thinks her allergy symptoms are improved with the use of Flonase.  Cough is resolved.   Review of Systems As above.    Patient Active Problem List   Diagnosis Date Noted  . Obesity, unspecified 05/11/2013     Prior to Admission medications   Medication Sig Start Date End Date Taking? Authorizing Provider  Azelastine HCl 0.15 % SOLN Place 2 sprays into both nostrils 2 (two) times daily. Patient not taking: Reported on 10/10/2017 05/22/16   Porfirio Oar, PA-C  cetirizine (ZYRTEC) 10 MG tablet Take 10 mg by mouth daily.    [provider]  fluticasone (FLONASE) 50 MCG/ACT nasal spray Place 2 sprays into both nostrils daily. 09/22/17   Doristine Bosworth, MD     No Known Allergies     Objective:  Physical Exam  Constitutional: She is oriented to person, place, and time. She appears well-developed and well-nourished. No distress.  BP 120/86   Pulse 80   Temp 98.6 F (37 C)   Resp 16   Ht 5' 7.5" (1.715 m)   Wt 253 lb 3.2 oz (114.9 kg)   SpO2 98%   BMI 39.07 kg/m    HENT:  Head: Normocephalic and atraumatic.  Right Ear: Hearing, tympanic membrane, external ear and ear canal normal.  Left Ear: External ear and ear canal normal. No lacerations. No drainage, swelling or tenderness. No foreign bodies. No mastoid tenderness. Tympanic membrane is injected and bulging. Tympanic membrane is not scarred, not perforated, not erythematous and not retracted. A middle ear  effusion is present. No hemotympanum. Decreased hearing is noted.  Nose: Mucosal edema and rhinorrhea present.  No foreign bodies. Right sinus exhibits no maxillary sinus tenderness and no frontal sinus tenderness. Left sinus exhibits no maxillary sinus tenderness and no frontal sinus tenderness.  Mouth/Throat: Uvula is midline, oropharynx is clear and moist and mucous membranes are normal. No uvula swelling. No oropharyngeal exudate.  Eyes: Pupils are equal, round, and reactive to light. Conjunctivae and EOM are normal. Right eye exhibits no discharge. Left eye exhibits no discharge. No scleral icterus.  Neck: Trachea normal, normal range of motion and full passive range of motion without pain. Neck supple. No thyroid mass and no thyromegaly present.  Cardiovascular: Normal rate, regular rhythm and normal heart sounds.  Pulmonary/Chest: Effort normal and breath sounds normal.  Lymphadenopathy:       Head (right side): No submandibular, no tonsillar, no preauricular, no posterior auricular and no occipital adenopathy present.       Head (left side): No submandibular, no tonsillar, no preauricular and no occipital adenopathy present.    She has no cervical adenopathy.       Right: No supraclavicular adenopathy present.       Left: No supraclavicular adenopathy present.  Neurological: She is alert and oriented to person, place, and time. She has normal strength. No cranial nerve deficit or sensory deficit.  Skin:  Skin is warm, dry and intact. No rash noted.  Psychiatric: She has a normal mood and affect. Her speech is normal and behavior is normal.           Assessment & Plan:   1. Non-recurrent acute suppurative otitis media of left ear without spontaneous rupture of tympanic membrane - amoxicillin-clavulanate (AUGMENTIN) 875-125 MG tablet; Take 1 tablet by mouth 2 (two) times daily for 10 days.  Dispense: 20 tablet; Refill: 0  2. Environmental allergies Continue Flonase.  Resume as a  lasting.  Continue cetirizine. - Azelastine HCl 0.15 % SOLN; Place 2 sprays into both nostrils 2 (two) times daily.  Dispense: 30 mL; Refill: 0    Return if symptoms worsen or fail to improve.   Fernande Bras, PA-C [facility]

## 2017-11-06 ENCOUNTER — Other Ambulatory Visit: Payer: Self-pay | Admitting: Physician Assistant

## 2017-11-06 DIAGNOSIS — Z9109 Other allergy status, other than to drugs and biological substances: Secondary | ICD-10-CM

## 2017-12-30 IMAGING — DX DG CERVICAL SPINE 2 OR 3 VIEWS
4 series · 4 of 4 positions shown · non-contrast
Comparison: None.

CLINICAL DATA: Acute cervicalgia

EXAM:
CERVICAL SPINE - 2-3 VIEW

[c-spine lat]
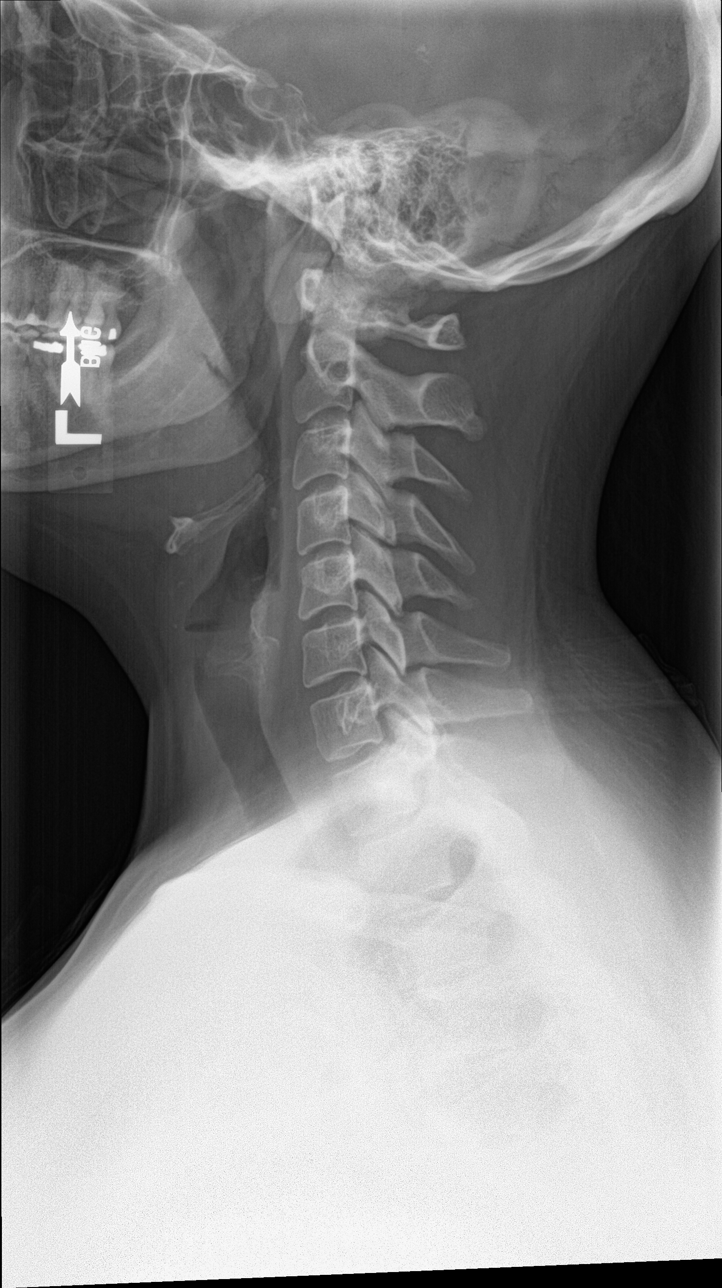

[c-spine ap]
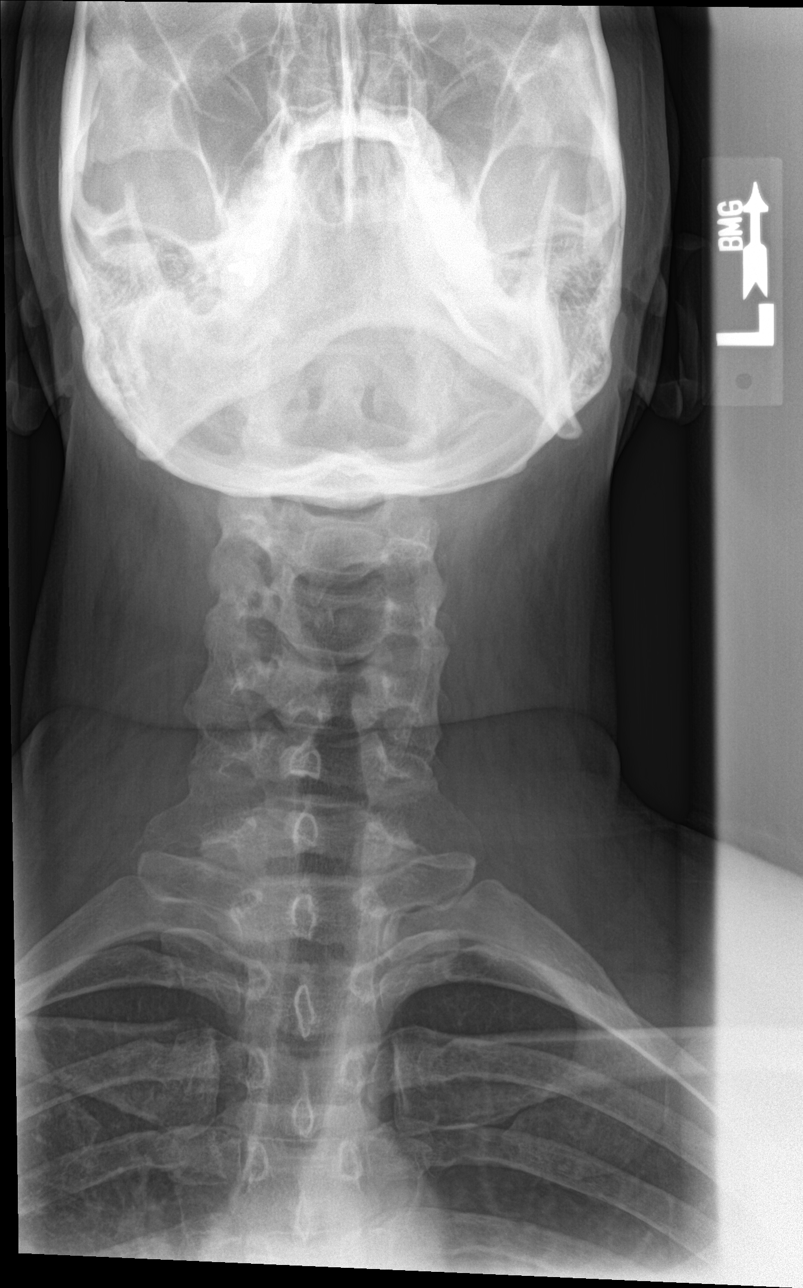

[c-spine open mouth]
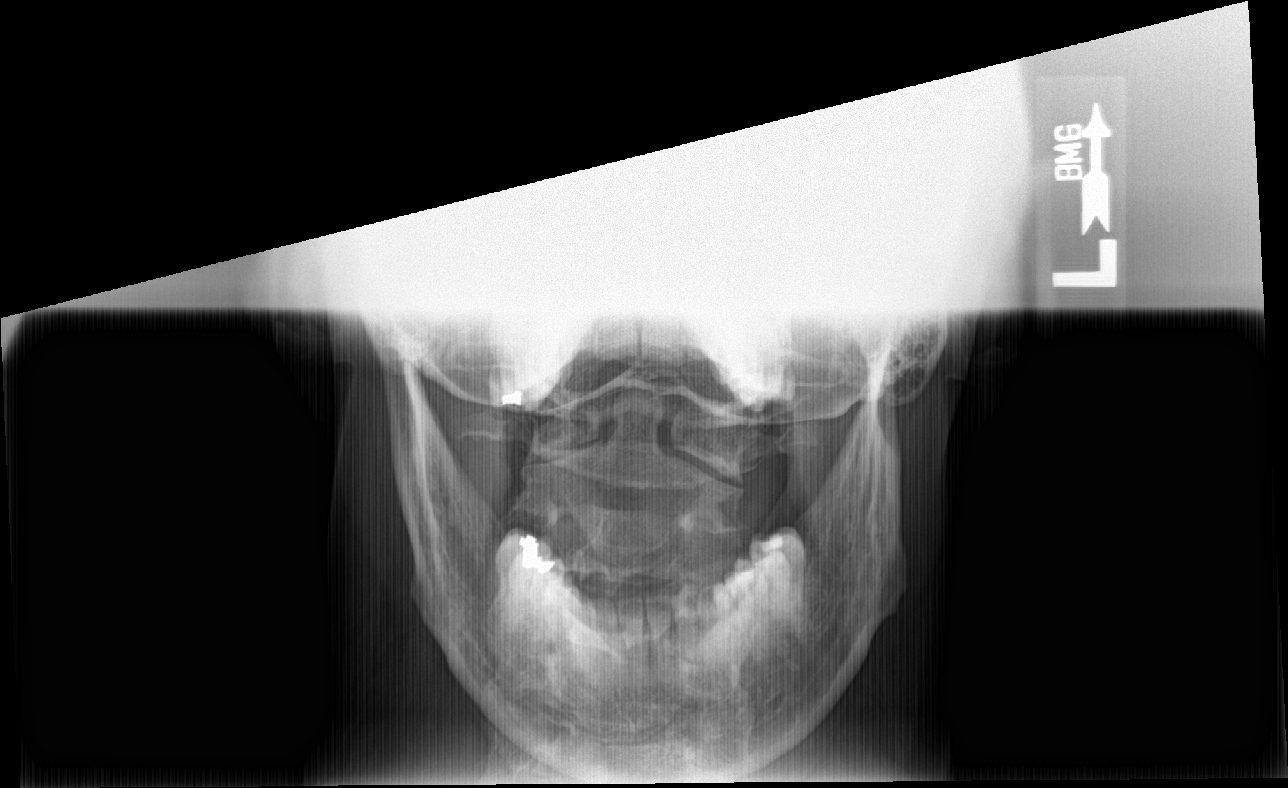

[c-spine swimmers]
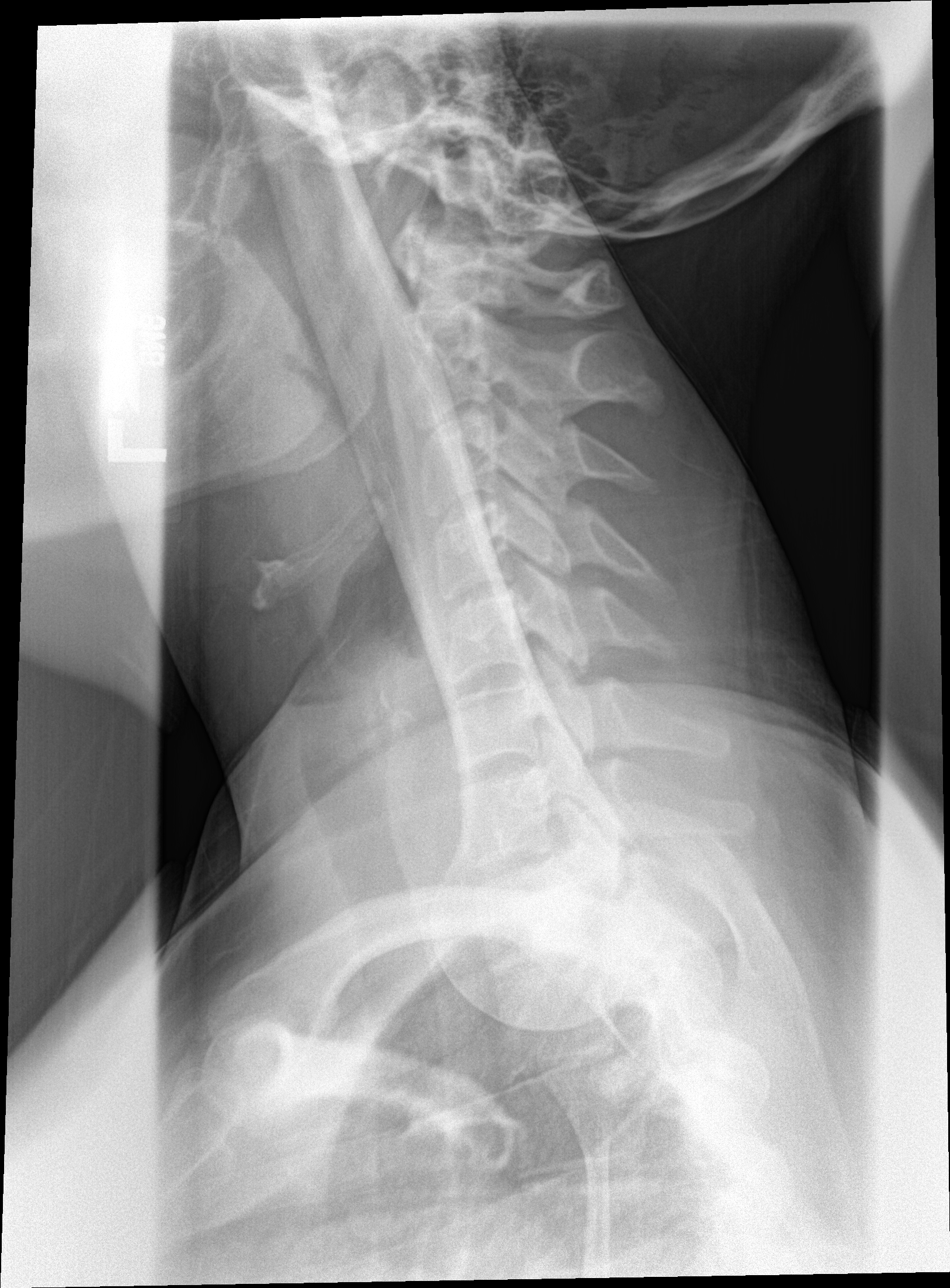

[4 of 4 positions shown; findings below may reference images not displayed]

FINDINGS: Frontal, lateral, and open-mouth odontoid images were obtained.
There is no fracture or spondylolisthesis. Prevertebral soft tissues
and predental space regions are normal. The disc spaces appear
normal. No erosive change.
IMPRESSION: No fracture or spondylolisthesis.  No evident arthropathy.

## 2018-06-10 ENCOUNTER — Ambulatory Visit
Admission: EM | Admit: 2018-06-10 | Discharge: 2018-06-10 | Disposition: A | Discharge status: Home / Self Care | Payer: BLUE CROSS/BLUE SHIELD | Attending: Emergency Medicine | Admitting: Emergency Medicine

## 2018-06-10 ENCOUNTER — Encounter: Payer: Self-pay | Admitting: Emergency Medicine

## 2018-06-10 DIAGNOSIS — J029 Acute pharyngitis, unspecified: Secondary | ICD-10-CM | POA: Diagnosis not present

## 2018-06-10 LAB — POCT RAPID STREP A (OFFICE): Rapid Strep A Screen: NEGATIVE

## 2018-06-10 MED ORDER — PSEUDOEPH-BROMPHEN-DM 30-2-10 MG/5ML PO SYRP: 5.0000 mL | ORAL_SOLUTION | Freq: Four times a day (QID) | ORAL | 0 refills | Status: DC | PRN

## 2018-06-10 MED ORDER — IBUPROFEN 600 MG PO TABS: 600.0000 mg | ORAL_TABLET | Freq: Four times a day (QID) | ORAL | 0 refills | Status: DC | PRN

## 2018-06-10 NOTE — Discharge Instructions (Signed)

## 2018-06-10 NOTE — ED Triage Notes (Signed)
Pt presents to [facility] for assessment of sore throat x 1 week.  Pt with a hx of tonsillectomy.

## 2018-06-10 NOTE — ED Provider Notes (Signed)
[facility]    CSN: [id number] Arrival date & time: 06/10/18  1040     History   Chief Complaint Chief Complaint  Patient presents with  . Sore Throat    HPI Destiny Skinner is a 33 y.o. female history of allergic rhinitis, previous tonsillectomy, presenting today for evaluation of a sore throat.  Patient has had a sore throat for approximately 1 week.  She has had mild occasional cough.  Minimal rhinorrhea.  Occasional ear discomfort with coughing.  She denies any fevers.  Denies any upset stomach.  She has been taking daily Zyrtec for allergies.  She is concerned that she has been around multiple people that have had a cough and has been sick.  HPI  Past Medical History:  Diagnosis Date  . Seasonal allergies     Patient Active Problem List   Diagnosis Date Noted  . Obesity, unspecified 05/11/2013    Past Surgical History:  Procedure Laterality Date  . TONSILLECTOMY  1993  . WISDOM TOOTH EXTRACTION      OB History   No obstetric history on file.      Home Medications    Prior to Admission medications   Medication Sig Start Date End Date Taking? Authorizing Provider  cetirizine (ZYRTEC) 10 MG tablet Take 10 mg by mouth daily.   Yes [provider]  Azelastine HCl 0.15 % SOLN Place 2 sprays into both nostrils 2 (two) times daily. 10/10/17   Porfirio Oar, PA  brompheniramine-pseudoephedrine-DM 30-2-10 MG/5ML syrup Take 5 mLs by mouth 4 (four) times daily as needed. 06/10/18   Rio Taber C, PA-C  fluticasone (FLONASE) 50 MCG/ACT nasal spray Place 2 sprays into both nostrils daily. 09/22/17   Doristine Bosworth, MD  ibuprofen (ADVIL,MOTRIN) 600 MG tablet Take 1 tablet (600 mg total) by mouth every 6 (six) hours as needed. 06/10/18   Serita Degroote, Junius Creamer, PA-C    Family History Family History  Problem Relation Age of Onset  . Diabetes Mother   . Hyperlipidemia Mother   . Hypertension Mother   . Hyperlipidemia Father   . Hypertension Father    . Heart disease Maternal Grandmother   . Heart disease Maternal Grandfather   . Diabetes Paternal Grandmother   . Cancer Paternal Grandfather     Social History Social History   Tobacco Use  . Smoking status: Never Smoker  . Smokeless tobacco: Never Used  Substance Use Topics  . Alcohol use: No    Alcohol/week: 0.0 standard drinks  . Drug use: No     Allergies   Patient has no known allergies.   Review of Systems Review of Systems  Constitutional: Negative for activity change, appetite change, chills, fatigue and fever.  HENT: Positive for congestion and sore throat. Negative for ear pain, rhinorrhea, sinus pressure and trouble swallowing.   Eyes: Negative for discharge and redness.  Respiratory: Positive for cough. Negative for chest tightness and shortness of breath.   Cardiovascular: Negative for chest pain.  Gastrointestinal: Negative for abdominal pain, diarrhea, nausea and vomiting.  Musculoskeletal: Negative for myalgias.  Skin: Negative for rash.  Neurological: Negative for dizziness, light-headedness and headaches.     Physical Exam Triage Vital Signs ED Triage Vitals  Enc Vitals Group     BP      Pulse      Resp      Temp      Temp src      SpO2      Weight  Height      Head Circumference      Peak Flow      Pain Score      Pain Loc      Pain Edu?      Excl. in GC?    No data found.  Updated Vital Signs BP 133/76 (BP Location: Left Arm)   Pulse 85   Temp 98.1 F (36.7 C) (Oral)   Resp 18   LMP 05/22/2018   SpO2 99%   Visual Acuity Right Eye Distance:   Left Eye Distance:   Bilateral Distance:    Right Eye Near:   Left Eye Near:    Bilateral Near:     Physical Exam Vitals signs and nursing note reviewed.  Constitutional:      General: She is not in acute distress.    Appearance: She is well-developed.  HENT:     Head: Normocephalic and atraumatic.     Ears:     Comments: Bilateral ears without tenderness to palpation  of external auricle, tragus and mastoid, EAC's without erythema or swelling, TM's with good bony landmarks and cone of light. Non erythematous.    Nose:     Comments: Nasal mucosa erythematous, rhinorrhea present bilaterally    Mouth/Throat:     Comments: Oral mucosa pink and moist, no tonsillar enlargement or exudate. Posterior pharynx patent and erythematous, no uvula deviation or swelling. Normal phonation. Eyes:     Conjunctiva/sclera: Conjunctivae normal.  Neck:     Musculoskeletal: Neck supple.  Cardiovascular:     Rate and Rhythm: Normal rate and regular rhythm.     Heart sounds: No murmur.  Pulmonary:     Effort: Pulmonary effort is normal. No respiratory distress.     Breath sounds: Normal breath sounds.     Comments: Breathing comfortably at rest, CTABL, no wheezing, rales or other adventitious sounds auscultated Abdominal:     Palpations: Abdomen is soft.     Tenderness: There is no abdominal tenderness.  Skin:    General: Skin is warm and dry.  Neurological:     Mental Status: She is alert.      UC Treatments / Results  Labs (all labs ordered are listed, but only abnormal results are displayed) Labs Reviewed  POCT RAPID STREP A (OFFICE) - Normal  CULTURE, GROUP A STREP [facility])    EKG None  Radiology No results found.  Procedures Procedures (including critical care time)  Medications Ordered in UC Medications - No data to display  Initial Impression / Assessment and Plan / UC Course  I have reviewed the triage vital signs and the nursing notes.  Pertinent labs & imaging results that were available during my care of the patient were reviewed by me and considered in my medical decision making (see chart for details).     Strep test negative, exam nonfocal, vital signs stable.  Most likely viral etiology.  Will recommend continued symptomatic and supportive care.  Recommendations below.  Will provide cough syrup with Sudafed to provide further management  of congestion that could be contributing to drainage.  Continue Zyrtec.  Push fluids.  Follow-up if symptoms continuing do not resolve or worsening.Discussed strict return precautions. Patient verbalized understanding and is agreeable with plan.  Final Clinical Impressions(s) / UC Diagnoses   Final diagnoses:  Sore throat     Discharge Instructions     Sore Throat  Your rapid strep tested Negative today. We will send for a culture and call in  about 2 days if results are positive. For now we will treat your sore throat as a virus with symptom management.   Please continue Tylenol or Ibuprofen for fever and pain. May try salt water gargles, cepacol lozenges, throat spray, or OTC cold relief medicine for throat discomfort. If you also have congestion take a daily anti-histamine like Zyrtec, Claritin, and a oral decongestant to help with post nasal drip that may be irritating your throat.   Stay hydrated and drink plenty of fluids to keep your throat coated relieve irritation.     ED Prescriptions    Medication Sig Dispense Auth. Provider   ibuprofen (ADVIL,MOTRIN) 600 MG tablet Take 1 tablet (600 mg total) by mouth every 6 (six) hours as needed. 30 tablet Kimblery Diop C, PA-C   brompheniramine-pseudoephedrine-DM 30-2-10 MG/5ML syrup Take 5 mLs by mouth 4 (four) times daily as needed. 120 mL Idolina Mantell C, PA-C     Controlled Substance Prescriptions [place] Controlled Substance Registry consulted? Not Applicable   Lew DawesWieters, Sui Kasparek C, [place]PA-C 06/10/18 1101

## 2018-06-14 LAB — CULTURE, GROUP A STREP (THRC)

## 2019-09-07 ENCOUNTER — Ambulatory Visit: Payer: BC Managed Care – PPO | Attending: Internal Medicine

## 2019-09-07 DIAGNOSIS — Z23 Encounter for immunization: Secondary | ICD-10-CM

## 2019-09-07 NOTE — Progress Notes (Signed)
   Covid-19 Vaccination Clinic  Name:  Lashonna Rieke    MRN: [id number] DOB: 1986-03-24  09/07/2019  Ms. Rauth was observed post Covid-19 immunization for 15 minutes without incident. She was provided with Vaccine Information Sheet and instruction to access the V-Safe system.   Ms. Wiesman was instructed to call 911 with any severe reactions post vaccine: Marland Kitchen Difficulty breathing  . Swelling of face and throat  . A fast heartbeat  . A bad rash all over body  . Dizziness and weakness   Immunizations Administered    Name Date Dose VIS Date Route   Pfizer COVID-19 Vaccine 09/07/2019  6:13 PM 0.3 mL 05/04/2019 Intramuscular   Manufacturer: [employer], [employer]   Lot: [id number]   NDC: [address]-9

## 2019-09-18 ENCOUNTER — Encounter: Payer: Self-pay | Admitting: Emergency Medicine

## 2019-09-18 ENCOUNTER — Ambulatory Visit
Admission: EM | Admit: 2019-09-18 | Discharge: 2019-09-18 | Disposition: A | Discharge status: Home / Self Care | Payer: BC Managed Care – PPO | Attending: Physician Assistant | Admitting: Physician Assistant

## 2019-09-18 ENCOUNTER — Other Ambulatory Visit: Payer: Self-pay

## 2019-09-18 DIAGNOSIS — Z20822 Contact with and (suspected) exposure to covid-19: Secondary | ICD-10-CM | POA: Diagnosis not present

## 2019-09-18 NOTE — ED Triage Notes (Signed)
Pt here for covid test with known exposure 4 days ago; pt denies sx

## 2019-09-18 NOTE — Discharge Instructions (Signed)
COVID testing ordered. As discussed, given recent exposure without symptoms, you may still be in incubation period. Monitor for any symptoms such as cough, congestion, shortness of breath, loss of taste/smell, fever, to start self quarantine and may need retesting. Go to the emergency department for further evaluation if you develop significant shortness of breath, cannot speak in full sentences.   To make appointment: You can text "COVID" to [phone] OR log on to [email or web address] If no smart phone or PC, you can call [phone] for assistance in setting up appointments.  COVID drive through sites:  [place]: [address] [place]: [address] [place]: [address] 

## 2019-09-18 NOTE — ED Provider Notes (Signed)
[facility]    CSN: [id number] Arrival date & time: 09/18/19  1059      History   Chief Complaint Chief Complaint  Patient presents with  . Covid Exposure    HPI Destiny Skinner is a 34 y.o. female.   34 year old female comes in for COVID testing after positive exposure 5 days ago. Patient remains asymptomatic. Denies URI symptoms such as cough, congestion, sore throat. Denies fever, chills, body aches. Denies abdominal pain, nausea, vomiting, diarrhea. Denies shortness of breath, loss of taste/smell. No antipyretics in the last 8 hours.     Past Medical History:  Diagnosis Date  . Seasonal allergies     Patient Active Problem List   Diagnosis Date Noted  . Obesity, unspecified 05/11/2013    Past Surgical History:  Procedure Laterality Date  . TONSILLECTOMY  1993  . WISDOM TOOTH EXTRACTION      OB History   No obstetric history on file.      Home Medications    Prior to Admission medications   Medication Sig Start Date End Date Taking? Authorizing Provider  Azelastine HCl 0.15 % SOLN Place 2 sprays into both nostrils 2 (two) times daily. 10/10/17   Porfirio Oar, PA  brompheniramine-pseudoephedrine-DM 30-2-10 MG/5ML syrup Take 5 mLs by mouth 4 (four) times daily as needed. Patient not taking: Reported on 09/18/2019 06/10/18   Wieters, Hallie C, PA-C  cetirizine (ZYRTEC) 10 MG tablet Take 10 mg by mouth daily.    [provider]  fluticasone (FLONASE) 50 MCG/ACT nasal spray Place 2 sprays into both nostrils daily. 09/22/17   Doristine Bosworth, MD  ibuprofen (ADVIL,MOTRIN) 600 MG tablet Take 1 tablet (600 mg total) by mouth every 6 (six) hours as needed. 06/10/18   Wieters, Junius Creamer, PA-C    Family History Family History  Problem Relation Age of Onset  . Diabetes Mother   . Hyperlipidemia Mother   . Hypertension Mother   . Hyperlipidemia Father   . Hypertension Father   . Heart disease Maternal Grandmother   . Heart disease Maternal  Grandfather   . Diabetes Paternal Grandmother   . Cancer Paternal Grandfather     Social History Social History   Tobacco Use  . Smoking status: Never Smoker  . Smokeless tobacco: Never Used  Substance Use Topics  . Alcohol use: No    Alcohol/week: 0.0 standard drinks  . Drug use: No     Allergies   Patient has no known allergies.   Review of Systems Review of Systems  Reason unable to perform ROS: See HPI as above.     Physical Exam Triage Vital Signs ED Triage Vitals  Enc Vitals Group     BP 09/18/19 1106 123/77     Pulse Rate 09/18/19 1106 79     Resp 09/18/19 1106 18     Temp 09/18/19 1106 97.8 F (36.6 C)     Temp Source 09/18/19 1106 Oral     SpO2 09/18/19 1106 98 %     Weight --      Height --      Head Circumference --      Peak Flow --      Pain Score 09/18/19 1107 0     Pain Loc --      Pain Edu? --      Excl. in GC? --    No data found.  Updated Vital Signs BP 123/77 (BP Location: Left Arm)   Pulse 79  Temp 97.8 F (36.6 C) (Oral)   Resp 18   SpO2 98%   Physical Exam Constitutional:      General: She is not in acute distress.    Appearance: Normal appearance. She is well-developed. She is not toxic-appearing or diaphoretic.  HENT:     Head: Normocephalic and atraumatic.  Eyes:     Conjunctiva/sclera: Conjunctivae normal.     Pupils: Pupils are equal, round, and reactive to light.  Pulmonary:     Effort: Pulmonary effort is normal. No respiratory distress.     Comments: Speaking in full sentences without difficulty Musculoskeletal:     Cervical back: Normal range of motion and neck supple.  Skin:    General: Skin is warm and dry.  Neurological:     Mental Status: She is alert and oriented to person, place, and time.      UC Treatments / Results  Labs (all labs ordered are listed, but only abnormal results are displayed) Labs Reviewed  NOVEL CORONAVIRUS, NAA    EKG   Radiology No results found.  Procedures  Procedures (including critical care time)  Medications Ordered in UC Medications - No data to display  Initial Impression / Assessment and Plan / UC Course  I have reviewed the triage vital signs and the nursing notes.  Pertinent labs & imaging results that were available during my care of the patient were reviewed by me and considered in my medical decision making (see chart for details).    Discussed with patient, given positive exposure without symptoms, could still be within incubation period. If develop symptoms, may need retesting.  Patient expresses understanding and would like to proceed with testing.  COVID testing ordered.  Patient will continue to monitor symptoms.  To self quarantine if develop any symptoms.  Return precautions given.  Final Clinical Impressions(s) / UC Diagnoses   Final diagnoses:  Close exposure to COVID-19 virus    ED Prescriptions    None     PDMP not reviewed this encounter.   Ok Edwards, PA-C 09/18/19 1131

## 2019-09-19 LAB — NOVEL CORONAVIRUS, NAA: SARS-CoV-2, NAA: NOT DETECTED

## 2019-09-19 LAB — SARS-COV-2, NAA 2 DAY TAT

## 2019-09-21 ENCOUNTER — Ambulatory Visit: Payer: BLUE CROSS/BLUE SHIELD

## 2019-10-02 ENCOUNTER — Ambulatory Visit: Payer: BC Managed Care – PPO | Attending: Internal Medicine

## 2019-10-02 DIAGNOSIS — Z23 Encounter for immunization: Secondary | ICD-10-CM

## 2019-10-02 NOTE — Progress Notes (Signed)
   Covid-19 Vaccination Clinic  Name:  Destiny Skinner    MRN: [id number] DOB: 1985-11-12  10/02/2019  [name] was observed post Covid-19 immunization for 15 minutes without incident. She was provided with Vaccine Information Sheet and instruction to access the V-Safe system.   [name] was instructed to call 911 with any severe reactions post vaccine: Marland Kitchen Difficulty breathing  . Swelling of face and throat  . A fast heartbeat  . A bad rash all over body  . Dizziness and weakness   Immunizations Administered    Name Date Dose VIS Date Route   Pfizer COVID-19 Vaccine 10/02/2019 10:28 AM 0.3 mL 07/18/2018 Intramuscular   Manufacturer: [employer], [employer]   Lot: [id number]   NDC: [address]-8

## 2020-01-10 ENCOUNTER — Other Ambulatory Visit: Payer: Self-pay

## 2020-01-10 ENCOUNTER — Ambulatory Visit: Payer: BC Managed Care – PPO | Admitting: Family Medicine

## 2020-01-10 ENCOUNTER — Encounter: Payer: Self-pay | Admitting: Family Medicine

## 2020-01-10 ENCOUNTER — Ambulatory Visit: Payer: BLUE CROSS/BLUE SHIELD | Admitting: Family Medicine

## 2020-01-10 VITALS — BP 129/82 | HR 60 | Temp 98.0°F | Ht 67.5 in | Wt 266.0 lb

## 2020-01-10 DIAGNOSIS — S9031XA Contusion of right foot, initial encounter: Secondary | ICD-10-CM | POA: Diagnosis not present

## 2020-01-10 DIAGNOSIS — M25571 Pain in right ankle and joints of right foot: Secondary | ICD-10-CM | POA: Diagnosis not present

## 2020-01-10 MED ORDER — DICLOFENAC SODIUM 75 MG PO TBEC: 75.0000 mg | DELAYED_RELEASE_TABLET | Freq: Two times a day (BID) | ORAL | 1 refills | Status: DC

## 2020-01-10 NOTE — Patient Instructions (Addendum)
   Take diclofenac 1 twice daily for pain and inflammation in ankle.    Try to avoid straining the ankle by wearing good supporting shoes.  Referral is being made to sports medicine.  Someone should contact you in the next couple of days regarding that referral.  If you do not hear from them by early next week call back and asked to speak to the referrals desk.  It is my opinion that you had a contusion that strained the ligaments in that ankle.  I think that the sports medicine doctor may be able to do a ultrasound of it to demonstrate where the pain is coming from, but they will have to decide on that.    If you have lab work done today you will be contacted with your lab results within the next 2 weeks.  If you have not heard from [place] then please contact us. The fastest way to get your results is to register for My Chart.   IF you received an x-ray today, you will receive an invoice from [facility]. Please contact [facility] at [phone] with questions or concerns regarding your invoice.   IF you received labwork today, you will receive an invoice from [place]. Please contact [facility] at [phone] with questions or concerns regarding your invoice.   Our billing staff will not be able to assist you with questions regarding bills from these companies.  You will be contacted with the lab results as soon as they are available. The fastest way to get your results is to activate your My Chart account. Instructions are located on the last page of this paperwork. If you have not heard from [place] regarding the results in 2 weeks, please contact this office.

## 2020-01-10 NOTE — Progress Notes (Signed)
Patient ID: Destiny Skinner, female    DOB: 03-28-1986  Age: 34 y.o. MRN: [id number]  Chief Complaint  Patient presents with  . R foot pain towards heel    limping -pain worse in last 2 months/ original injury 1 yr ago - board fell on it     Subjective:  34 year old lady who was helping work on a relatives house a year ago and a board of sheeting fell hitting her on the medial aspect of the right ankle.  She did not see a doctor.  It hurt for a time and then subsided but then started hurting more and over the last couple months she had a lot of constant pain there.  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  She only now is coming to see a doctor for it.  Current allergies, medications, problem list, past/family and social histories reviewed.  Objective:  BP 129/82   Pulse 60   Temp 98 F (36.7 C)   Ht 5' 7.5" (1.715 m)   Wt 266 lb (120.7 kg)   SpO2 99%   BMI 41.05 kg/m  Very tender on the medial aspect of the right ankle below and behind the medial malleolus.  Not really tender on the Achilles tenderness.  She is not tender at the base of the calcaneus, but does have a little tenderness on the side of the calcaneus.  However the most tenderness in the just above the calcaneus.  Assessment & Plan:   Assessment: 1. Pain in joint involving right ankle and foot   2. Contusion of right foot, initial encounter       Plan: I think she can take contused her ligaments connecting some of the smaller bones in the heel.  And I feel she needs to be evaluated by a sports medicine doctor who can probably use an ultrasound to identify where the pain is coming from and where the inflammation is.  For now and place her on NSAIDs until he can see someone else.  Decided to hold off on putting her in a boot since it has been so long.  Orders Placed This Encounter  Procedures  . Ambulatory referral to Sports Medicine    Referral Priority:   Routine     Referral Type:   Consultation    Referred to Provider:   Otho Darner, MD    Number of Visits Requested:   1    Meds ordered this encounter  Medications  . diclofenac (VOLTAREN) 75 MG EC tablet    Sig: Take 1 tablet (75 mg total) by mouth 2 (two) times daily.    Dispense:  30 tablet    Refill:  1         Patient Instructions     Take diclofenac 1 twice daily for pain and inflammation in ankle.    Try to avoid straining the ankle by wearing good supporting shoes.  Referral is being made to sports medicine.  Someone should contact you in the next couple of days regarding that referral.  If you do not hear from them by early next week call back and asked to speak to the referrals desk.  It is my opinion that you had a contusion that strained the ligaments in that ankle.  I think that the sports medicine doctor may be able to do a ultrasound of it to demonstrate where the pain is coming from, but they will  have to decide on that.    If you have lab work done today you will be contacted with your lab results within the next 2 weeks.  If you have not heard from [place] then please contact us. The fastest way to get your results is to register for My Chart.   IF you received an x-ray today, you will receive an invoice from [facility]. Please contact [facility] at [phone] with questions or concerns regarding your invoice.   IF you received labwork today, you will receive an invoice from [facility]. Please contact [facility] at [phone] with questions or concerns regarding your invoice.   Our billing staff will not be able to assist you with questions regarding bills from these companies.  You will be contacted with the lab results as soon as they are available. The fastest way to get your results is to activate your My Chart account. Instructions are located on the last page of this paperwork. If you have not heard from [place] regarding the results in 2 weeks,  please contact this office.         Return if symptoms worsen or fail to improve.   Janace Hoard, MD 01/10/2020

## 2020-05-02 ENCOUNTER — Ambulatory Visit (INDEPENDENT_AMBULATORY_CARE_PROVIDER_SITE_OTHER): Payer: BC Managed Care – PPO

## 2020-05-02 ENCOUNTER — Other Ambulatory Visit: Payer: Self-pay | Admitting: Podiatry

## 2020-05-02 ENCOUNTER — Other Ambulatory Visit: Payer: Self-pay

## 2020-05-02 ENCOUNTER — Ambulatory Visit: Payer: BC Managed Care – PPO | Admitting: Podiatry

## 2020-05-02 DIAGNOSIS — M216X9 Other acquired deformities of unspecified foot: Secondary | ICD-10-CM | POA: Diagnosis not present

## 2020-05-02 DIAGNOSIS — M7731 Calcaneal spur, right foot: Secondary | ICD-10-CM

## 2020-05-02 DIAGNOSIS — M722 Plantar fascial fibromatosis: Secondary | ICD-10-CM | POA: Diagnosis not present

## 2020-05-02 DIAGNOSIS — M79671 Pain in right foot: Secondary | ICD-10-CM

## 2020-05-02 MED ORDER — BETAMETHASONE SOD PHOS & ACET 6 (3-3) MG/ML IJ SUSP
6.0000 mg | Freq: Once | INTRAMUSCULAR | Status: AC
  Administered 2020-05-02: 6 mg

## 2020-05-02 NOTE — Patient Instructions (Signed)

## 2020-05-02 NOTE — Progress Notes (Signed)
  Subjective:  Patient ID: Destiny Skinner, female    DOB: November 11, 1985,  MRN: [id number]  Chief Complaint  Patient presents with  . Plantar Fasciitis    Pt states diagnosis of plantar fasciitis in her right heel, painful on plantar and medial aspects, diagnosis of heel spur. 2 year duration.   34 y.o. female presents with the above complaint. History confirmed with patient.   Objective:  Physical Exam: warm, good capillary refill, no trophic changes or ulcerative lesions, normal DP and PT pulses and normal sensory exam. Right Foot: tenderness to palpation medial calcaneal tuber, no pain with calcaneal squeeze, decreased ankle joint ROM and +Silverskiold test  Radiographs: X-ray of the left foot: no evidence of calcaneal stress fracture, plantar calcaneal spur, posterior calcaneal spur and Haglund deformity noted  Assessment:   1. Plantar fasciitis   2. Equinus deformity of foot   3. Calcaneal spur of right foot    Plan:  Patient was evaluated and treated and all questions answered.  Plantar Fasciitis -XR reviewed with patient -Educated patient on stretching and icing of the affected limb -Plantar fascial brace dispensed -Injection delivered to the plantar fascia of the right foot.  Procedure: Injection Tendon/Ligament Consent: Verbal consent obtained. Location: Right plantar fascia at the glabrous junction; medial approach. Skin Prep: Alcohol. Injectate: 1 cc 0.5% marcaine plain, 1 cc dexamethasone phosphate, 0.5 cc kenalog 10. Disposition: Patient tolerated procedure well. Injection site dressed with a band-aid.  Return in about 1 month (around 06/02/2020) for Plantar fasciitis, Right.

## 2020-05-24 HISTORY — PX: FOOT SURGERY: SHX648

## 2020-06-03 ENCOUNTER — Other Ambulatory Visit: Payer: Self-pay

## 2020-06-03 ENCOUNTER — Ambulatory Visit: Payer: BC Managed Care – PPO | Admitting: Podiatry

## 2020-06-03 DIAGNOSIS — M722 Plantar fascial fibromatosis: Secondary | ICD-10-CM

## 2020-06-03 MED ORDER — BETAMETHASONE SOD PHOS & ACET 6 (3-3) MG/ML IJ SUSP
6.0000 mg | Freq: Once | INTRAMUSCULAR | Status: AC
  Administered 2020-06-03: 6 mg

## 2020-06-03 NOTE — Progress Notes (Signed)
  Subjective:  Patient ID: Destiny Skinner, female    DOB: 08-24-1985,  MRN: [id number]  Chief Complaint  Patient presents with  . Plantar Fasciitis    Right heel medial aspect still painful, injection helped for about 3 weeks. "Do I need surgery"   35 y.o. female presents with the above complaint. History confirmed with patient.   Objective:  Physical Exam: warm, good capillary refill, no trophic changes or ulcerative lesions, normal DP and PT pulses and normal sensory exam. Right Foot: tenderness to palpation medial calcaneal tuber, no pain with calcaneal squeeze, decreased ankle joint ROM and +Silverskiold test  Assessment:   1. Plantar fasciitis    Plan:  Patient was evaluated and treated and all questions answered.  Plantar Fasciitis -Repeat injection as below -Should issues persist consider surgical intervention consisting of EPF and possible gastrocnemius recession.  Procedure: Injection Tendon/Ligament Consent: Verbal consent obtained. Location: Right plantar fascia at the glabrous junction; medial approach. Skin Prep: Alcohol. Injectate: 1 cc 0.5% marcaine plain, 1 cc celestone  Disposition: Patient tolerated procedure well. Injection site dressed with a band-aid.  Return in about 4 weeks (around 07/01/2020) for Plantar fasciitis, Right.

## 2020-07-01 ENCOUNTER — Ambulatory Visit: Payer: BC Managed Care – PPO | Admitting: Podiatry

## 2020-07-04 ENCOUNTER — Other Ambulatory Visit: Payer: Self-pay

## 2020-07-04 ENCOUNTER — Ambulatory Visit: Payer: BC Managed Care – PPO | Admitting: Podiatry

## 2020-07-04 DIAGNOSIS — Z6841 Body Mass Index (BMI) 40.0 and over, adult: Secondary | ICD-10-CM

## 2020-07-04 DIAGNOSIS — M216X9 Other acquired deformities of unspecified foot: Secondary | ICD-10-CM

## 2020-07-04 DIAGNOSIS — M7731 Calcaneal spur, right foot: Secondary | ICD-10-CM

## 2020-07-04 DIAGNOSIS — M722 Plantar fascial fibromatosis: Secondary | ICD-10-CM

## 2020-07-04 NOTE — Progress Notes (Signed)
  Subjective:  Patient ID: Destiny Skinner, female    DOB: 13-Mar-1986,  MRN: [id number]  No chief complaint on file.  35 y.o. female presents for follow up. States that the injection only helped temporarilty.  Objective:  Physical Exam: warm, good capillary refill, no trophic changes or ulcerative lesions, normal DP and PT pulses and normal sensory exam. Right Foot: tenderness to palpation medial calcaneal tuber, no pain with calcaneal squeeze, decreased ankle joint ROM and +Silverskiold test  Assessment:   1. Plantar fasciitis   2. Equinus deformity of foot   3. Calcaneal spur of right foot   4. Class 3 severe obesity due to excess calories without serious comorbidity with body mass index (BMI) of 40.0 to 44.9 in adult [facility])    Plan:  Patient was evaluated and treated and all questions answered.  Plantar Fasciitis -Patient has failed all conservative therapy and wishes to proceed with surgical intervention. All risks, benefits, and alternatives discussed with patient. No guarantees given. Consent reviewed and signed by patient. -Planned procedures: Endoscopic plantar fasciotomy right, open vs endoscopic gastrocnemius recession right -ASA 2 - Patient with mild systemic disease with no functional limitations; Risk factors: Obesity (BMI 41.3) -Post-op anticoagulation: chemoprophylaxis not indicated -DME dispensed for post-op use: [facility]  No follow-ups on file.

## 2020-08-18 ENCOUNTER — Ambulatory Visit
Admission: EM | Admit: 2020-08-18 | Discharge: 2020-08-18 | Disposition: A | Discharge status: Home / Self Care | Payer: BC Managed Care – PPO

## 2020-08-18 ENCOUNTER — Other Ambulatory Visit: Payer: Self-pay

## 2020-08-18 ENCOUNTER — Encounter: Payer: Self-pay | Admitting: Emergency Medicine

## 2020-08-18 DIAGNOSIS — R21 Rash and other nonspecific skin eruption: Secondary | ICD-10-CM | POA: Diagnosis not present

## 2020-08-18 MED ORDER — VALACYCLOVIR HCL 1 G PO TABS
1000.0000 mg | ORAL_TABLET | Freq: Three times a day (TID) | ORAL | 0 refills | Status: AC
Start: 2020-08-18 — End: 2020-08-25

## 2020-08-18 MED ORDER — TRIAMCINOLONE ACETONIDE 0.1 % EX CREA
1.0000 | TOPICAL_CREAM | Freq: Two times a day (BID) | CUTANEOUS | 0 refills | Status: DC
Start: 2020-08-18 — End: 2023-05-11

## 2020-08-18 NOTE — Discharge Instructions (Signed)
I have sent the antiviral to help if this is early shingles.  It is possible this is more of a local reaction or bug bites etc so I think you can also use kenalog cream to help with the itching.  If symptoms worsen or do not improve in the next week to return to be seen or to follow up with your PCP.

## 2020-08-18 NOTE — ED Triage Notes (Addendum)
Single line of bumps on right back in bra line.  Bumps itch, but no pain.   No blisters, small red bumps.

## 2020-08-18 NOTE — ED Provider Notes (Signed)
[facility]    CSN: [id number] Arrival date & time: 08/18/20  0905      History   Chief Complaint Chief Complaint  Patient presents with  . Rash    HPI Destiny Skinner is a 35 y.o. female.   Destiny Skinner presents with complaints of rash to her right back which she noted last night. Itches. No pain or burning sensation. No spread. No known exposure to new products or insects etc. She is concerned about shingles as her father in law had shingle recently, she did have chicken pox as a child. No fevers. Hasn't tried any treatments for this. Denies any previous similar.     ROS per HPI, negative if not otherwise mentioned.      Past Medical History:  Diagnosis Date  . Seasonal allergies     Patient Active Problem List   Diagnosis Date Noted  . Obesity, unspecified 05/11/2013    Past Surgical History:  Procedure Laterality Date  . TONSILLECTOMY  1993  . WISDOM TOOTH EXTRACTION      OB History   No obstetric history on file.      Home Medications    Prior to Admission medications   Medication Sig Start Date End Date Taking? Authorizing Provider  cetirizine (ZYRTEC) 10 MG tablet Take 10 mg by mouth daily.   Yes [provider]  Multiple Vitamin (MULTIVITAMIN) capsule Take 1 capsule by mouth daily.   Yes [provider]  triamcinolone (KENALOG) 0.1 % Apply 1 application topically 2 (two) times daily. 08/18/20  Yes Ryzen Deady, Barron Alvine, NP  valACYclovir (VALTREX) 1000 MG tablet Take 1 tablet (1,000 mg total) by mouth 3 (three) times daily for 7 days. 08/18/20 08/25/20 Yes Tyrena Gohr, Barron Alvine, NP  Azelastine HCl 0.15 % SOLN Place 2 sprays into both nostrils 2 (two) times daily. 10/10/17   Porfirio Oar, PA  diclofenac (VOLTAREN) 75 MG EC tablet Take 1 tablet (75 mg total) by mouth 2 (two) times daily. 01/10/20   Peyton Najjar, MD  fluticasone (FLONASE) 50 MCG/ACT nasal spray Place 2 sprays into both nostrils daily. 09/22/17   Doristine Bosworth, MD  naproxen sodium (ALEVE) 220 MG tablet Take 220 mg by mouth. Patient not taking: Reported on 08/18/2020    [provider]    Family History Family History  Problem Relation Age of Onset  . Diabetes Mother   . Hyperlipidemia Mother   . Hypertension Mother   . Hyperlipidemia Father   . Hypertension Father   . Heart disease Maternal Grandmother   . Heart disease Maternal Grandfather   . Diabetes Paternal Grandmother   . Cancer Paternal Grandfather     Social History Social History   Tobacco Use  . Smoking status: Never Smoker  . Smokeless tobacco: Never Used  Substance Use Topics  . Alcohol use: No    Alcohol/week: 0.0 standard drinks  . Drug use: No     Allergies   Patient has no known allergies.   Review of Systems Review of Systems   Physical Exam Triage Vital Signs ED Triage Vitals  Enc Vitals Group     BP 08/18/20 0926 129/83     Pulse Rate 08/18/20 0926 74     Resp 08/18/20 0926 20     Temp 08/18/20 0926 98.2 F (36.8 C)     Temp Source 08/18/20 0926 Oral     SpO2 08/18/20 0926 99 %     Weight --  Height --      Head Circumference --      Peak Flow --      Pain Score 08/18/20 0922 0     Pain Loc --      Pain Edu? --      Excl. in GC? --    No data found.  Updated Vital Signs BP 129/83 (BP Location: Right Arm) Comment (BP Location): large cuff  Pulse 74   Temp 98.2 F (36.8 C) (Oral)   Resp 20   LMP 08/08/2020   SpO2 99%   Visual Acuity Right Eye Distance:   Left Eye Distance:   Bilateral Distance:    Right Eye Near:   Left Eye Near:    Bilateral Near:     Physical Exam Constitutional:      General: She is not in acute distress.    Appearance: She is well-developed.  Cardiovascular:     Rate and Rhythm: Normal rate.  Pulmonary:     Effort: Pulmonary effort is normal.  Skin:    General: Skin is warm and dry.          Comments: Approximately 5-6 red papules in linear pattern without surrounding redness, no  pustule or vesicle, no blister; non tender   Neurological:     Mental Status: She is alert and oriented to person, place, and time.      UC Treatments / Results  Labs (all labs ordered are listed, but only abnormal results are displayed) Labs Reviewed - No data to display  EKG   Radiology No results found.  Procedures Procedures (including critical care time)  Medications Ordered in UC Medications - No data to display  Initial Impression / Assessment and Plan / UC Course  I have reviewed the triage vital signs and the nursing notes.  Pertinent labs & imaging results that were available during my care of the patient were reviewed by me and considered in my medical decision making (see chart for details).     Insect bites vs pimples vs shingles discussed and considered. Opted to provide valtrex at this time due to onset last night. Kenalog to help with itching, particularly if this is related to insect bite. Return precautions provided. Patient verbalized understanding and agreeable to plan.   Final Clinical Impressions(s) / UC Diagnoses   Final diagnoses:  Rash and nonspecific skin eruption     Discharge Instructions     I have sent the antiviral to help if this is early shingles.  It is possible this is more of a local reaction or bug bites etc so I think you can also use kenalog cream to help with the itching.  If symptoms worsen or do not improve in the next week to return to be seen or to follow up with your PCP.     ED Prescriptions    Medication Sig Dispense Auth. Provider   valACYclovir (VALTREX) 1000 MG tablet Take 1 tablet (1,000 mg total) by mouth 3 (three) times daily for 7 days. 21 tablet Linus Mako B, NP   triamcinolone (KENALOG) 0.1 % Apply 1 application topically 2 (two) times daily. 30 g Georgetta Haber, NP     PDMP not reviewed this encounter.   Georgetta Haber, NP 08/18/20 1016

## 2020-08-20 ENCOUNTER — Telehealth: Payer: Self-pay | Admitting: Urology

## 2020-08-20 NOTE — Telephone Encounter (Signed)
DOS - 09/03/20  EPF RIGHT --- 82423 GASTROCNEMIUS RECESS RIGHT --- 53614   [employer] EFFCTIVE DATE - 09/02/13   SPOKE WITH KELLY WITH [employer] FOR CPT CODES 43154 AND [phone] NO PRIOR AUTH IS REQUIRED.  REF# [id number]

## 2020-08-21 ENCOUNTER — Telehealth: Payer: Self-pay | Admitting: Podiatry

## 2020-08-21 DIAGNOSIS — M722 Plantar fascial fibromatosis: Secondary | ICD-10-CM

## 2020-08-21 NOTE — Telephone Encounter (Signed)
Patient would like to get a knee scooter after surgery and she was wondering if her insurance would pay for it. I told her that I will give you the message and you would contact her.

## 2020-08-22 NOTE — Telephone Encounter (Signed)
Destiny Skinner is having surgery on 09/03/2020 and is requesting a knee scooter. Please advise if she can have one. If so, will you please place an order in Epic then I will contact [employer]. Thanks

## 2020-09-01 DIAGNOSIS — M79676 Pain in unspecified toe(s): Secondary | ICD-10-CM

## 2020-09-01 NOTE — Addendum Note (Signed)
Addended by: Ventura Sellers on: 09/01/2020 02:21 PM   Modules accepted: Orders

## 2020-09-02 ENCOUNTER — Telehealth: Payer: Self-pay | Admitting: Podiatry

## 2020-09-02 MED ORDER — ONDANSETRON HCL 4 MG PO TABS: 4.0000 mg | ORAL_TABLET | Freq: Three times a day (TID) | ORAL | 0 refills | Status: DC | PRN

## 2020-09-02 MED ORDER — OXYCODONE-ACETAMINOPHEN 5-325 MG PO TABS: 1.0000 | ORAL_TABLET | ORAL | 0 refills | Status: DC | PRN

## 2020-09-02 MED ORDER — CEPHALEXIN 500 MG PO CAPS
500.0000 mg | ORAL_CAPSULE | Freq: Two times a day (BID) | ORAL | 0 refills | Status: DC
Start: 2020-09-02 — End: 2021-10-06

## 2020-09-02 NOTE — Telephone Encounter (Signed)
Pre-op call. Sent medications to pharmacy. Answered all questions for surgery.

## 2020-09-03 ENCOUNTER — Encounter: Payer: Self-pay | Admitting: Podiatry

## 2020-09-03 DIAGNOSIS — M722 Plantar fascial fibromatosis: Secondary | ICD-10-CM | POA: Diagnosis not present

## 2020-09-03 DIAGNOSIS — M216X1 Other acquired deformities of right foot: Secondary | ICD-10-CM

## 2020-09-04 ENCOUNTER — Telehealth: Payer: Self-pay | Admitting: Podiatry

## 2020-09-04 NOTE — Telephone Encounter (Signed)
Called patient and discussed OTC meds she could take

## 2020-09-04 NOTE — Telephone Encounter (Signed)
Patient called our office today stating she had surgery  09/03/20 , she thinks has a cold but is not sure if she can take any other medication with the medication that Dr.Price has prescribe her. I called the patient back to talk with her she and she states that she has talked  to Dr.Price today.

## 2020-09-04 NOTE — Telephone Encounter (Signed)
Patient husband called and stated that she is taking Keflex and she wanted to know what kind of decongestant medication OTC she could take since she has allergies. Please call patient as soon as you can.

## 2020-09-05 ENCOUNTER — Telehealth: Payer: Self-pay | Admitting: Podiatry

## 2020-09-05 NOTE — Telephone Encounter (Signed)
Spoke the patient and her husband she still having sinus pressure and congestion, no chest tightness or difficulty breathing.  Had a temperature of 100.5 last night.  She is going to take daytime over-the-counter cold and sinus medicine and has an at home COVID test.  They will let me know if it is positive.  Advised to rest drink plain fluids and take the daytime medicine.  If she develops chest pain tightness or difficulty breathing then go to ER immediately or if fever increases beyond 100.5.  Sharl Ma, DPM 09/05/2020

## 2020-09-08 ENCOUNTER — Telehealth: Payer: Self-pay | Admitting: Podiatry

## 2020-09-08 NOTE — Telephone Encounter (Signed)
Patient had to moved post op appointment, stated she had a positive covid test. She is r/s 09/16/2020.

## 2020-09-09 ENCOUNTER — Encounter: Payer: BC Managed Care – PPO | Admitting: Podiatry

## 2020-09-11 NOTE — Telephone Encounter (Signed)
Noted  

## 2020-09-12 ENCOUNTER — Encounter: Payer: BC Managed Care – PPO | Admitting: Podiatry

## 2020-09-15 ENCOUNTER — Telehealth: Payer: Self-pay | Admitting: *Deleted

## 2020-09-15 NOTE — Telephone Encounter (Signed)
Was addressed earlier today. I believe Ladona Ridgel spoke to patient

## 2020-09-15 NOTE — Telephone Encounter (Signed)
Patient called and said that she has tested Positive for Covid on April 16 th and has remained quarantined since but now her husband just tested positive on the 22nd, has an upcoming post surgery appointment scheduled for 09/16/20. What should she do concerning her post ops?Please advise.

## 2020-09-16 ENCOUNTER — Encounter: Payer: BC Managed Care – PPO | Admitting: Podiatry

## 2020-09-23 ENCOUNTER — Other Ambulatory Visit: Payer: Self-pay

## 2020-09-23 ENCOUNTER — Ambulatory Visit (INDEPENDENT_AMBULATORY_CARE_PROVIDER_SITE_OTHER): Payer: BC Managed Care – PPO | Admitting: Podiatry

## 2020-09-23 DIAGNOSIS — M722 Plantar fascial fibromatosis: Secondary | ICD-10-CM

## 2020-09-23 NOTE — Progress Notes (Signed)
  Subjective:  Patient ID: Destiny Skinner, female    DOB: 25-Jul-1985,  MRN: [id number]  Chief Complaint  Patient presents with  . Routine Post Op    POV #2 DOS 09/03/2020 RT FOOT EPF, GASTROCNEMIUS RECESS. Denies f/c/n/v. Manageable pain lvls. Dried blood on wrappings.    DOS: 09/03/20 Procedure: EPF, EGR right  35 y.o. female presents with the above complaint. History confirmed with patient.   Objective:  Physical Exam: tenderness at the surgical site, local edema noted and calf supple, nontender. Incision: healing well, no significant drainage, no dehiscence  Assessment:   1. Plantar fasciitis     Plan:  Patient was evaluated and treated and all questions answered.  Post-operative State -Sutures removed -Staples removed -Ok to start showering at this time. Advised they cannot soak. -WBAT in Surgical shoe -Surgical shoe dispensed  Return in about 2 weeks (around 10/07/2020) for Post-Op (No XRs).

## 2020-10-07 ENCOUNTER — Encounter: Payer: Self-pay | Admitting: Podiatry

## 2020-10-07 ENCOUNTER — Other Ambulatory Visit: Payer: Self-pay

## 2020-10-07 ENCOUNTER — Ambulatory Visit (INDEPENDENT_AMBULATORY_CARE_PROVIDER_SITE_OTHER): Payer: BC Managed Care – PPO | Admitting: Podiatry

## 2020-10-07 DIAGNOSIS — Z9889 Other specified postprocedural states: Secondary | ICD-10-CM

## 2020-10-07 DIAGNOSIS — M216X9 Other acquired deformities of unspecified foot: Secondary | ICD-10-CM

## 2020-10-07 DIAGNOSIS — M722 Plantar fascial fibromatosis: Secondary | ICD-10-CM

## 2020-10-07 NOTE — Progress Notes (Signed)
  Subjective:  Patient ID: Destiny Skinner, female    DOB: [birth year],  MRN: [id number]  Chief Complaint  Patient presents with  . Routine Post Op    POV #3 DOS 09/03/2020 RT FOOT EPF, GASTROCNEMIUS RECESS. Pt states she has been doing well. Occasional sharp pains at heel.    DOS: 09/03/20 Procedure: EPF, EGR right  35 y.o. female presents with the above complaint. History confirmed with patient. Does have occasional sharp pains in her foot that do not last and go away. Is wearing her surgical shoe today.  Objective:  Physical Exam: tenderness at the surgical site, local edema noted and calf supple, nontender. Incision: well healed.  Assessment:   1. Plantar fasciitis   2. Equinus deformity of foot   3. Post-operative state     Plan:  Patient was evaluated and treated and all questions answered.  Post-operative State -Transition to normal shoegear with heel pad. I think she should be ready to return to work in 2 weeks. She did have a slight care delay 2/2 Covid. Estimated Return to Work 10/22/20 -D/c ointment and band-aids to incisions  Return in about 3 weeks (around 10/28/2020) for Post-Op (No XRs).

## 2020-10-28 ENCOUNTER — Ambulatory Visit (INDEPENDENT_AMBULATORY_CARE_PROVIDER_SITE_OTHER): Payer: BC Managed Care – PPO | Admitting: Podiatry

## 2020-10-28 ENCOUNTER — Other Ambulatory Visit: Payer: Self-pay

## 2020-10-28 DIAGNOSIS — M722 Plantar fascial fibromatosis: Secondary | ICD-10-CM

## 2020-10-28 DIAGNOSIS — M216X9 Other acquired deformities of unspecified foot: Secondary | ICD-10-CM

## 2020-10-28 NOTE — Progress Notes (Signed)
  Subjective:  Patient ID: Destiny Skinner, female    DOB: March 22, 1986,  MRN: [id number]  Chief Complaint  Patient presents with  . Routine Post Op    POV #4 DOS 09/03/2020 RT FOOT EPF, GASTROCNEMIUS RECESS. Pt states she went back to work on last Wednesday. Pt has increased in her entire right heel. No pain at surgical site.    DOS: 09/03/20 Procedure: EPF, EGR right  35 y.o. female presents with the above complaint. History confirmed with patient. Does have occasional sharp pains in her foot that do not last and go away. Is wearing her surgical shoe today.  Objective:  Physical Exam: tenderness at the surgical site, local edema noted and calf supple, nontender. Incision: well healed. Assessment:   1. Plantar fasciitis   2. Equinus deformity of foot    Plan:  Patient was evaluated and treated and all questions answered.  Post-operative State -Transition to normal shoegear with heel pad. I think she would benefit from Powerstep inserts. These were dispensed today. Offered CMOs, declined 2/2 cost.  Return in about 3 weeks (around 11/18/2020).

## 2020-11-21 ENCOUNTER — Other Ambulatory Visit: Payer: Self-pay

## 2020-11-21 ENCOUNTER — Ambulatory Visit (INDEPENDENT_AMBULATORY_CARE_PROVIDER_SITE_OTHER): Payer: BC Managed Care – PPO | Admitting: Podiatry

## 2020-11-21 DIAGNOSIS — M216X9 Other acquired deformities of unspecified foot: Secondary | ICD-10-CM

## 2020-11-21 DIAGNOSIS — M722 Plantar fascial fibromatosis: Secondary | ICD-10-CM

## 2020-11-21 NOTE — Progress Notes (Signed)
  Subjective:  Patient ID: Destiny Skinner, female    DOB: 14-Sep-1985,  MRN: [id number]  Chief Complaint  Patient presents with   Routine Post Op    POV#5 DOS 4.13.2022 Pt states healing well, occasional plantar heel pain and occasional sharp needle-like pain around surgical site, usually at rest.   DOS: 09/03/20 Procedure: EPF, EGR right  35 y.o. female presents with the above complaint. History confirmed with patient. Only has brief occasional sharp pain but is overall doing well with minimal pain.  Objective:  Physical Exam: tenderness at the surgical site, local edema noted and calf supple, nontender. Incision: well healed. Assessment:   1. Plantar fasciitis   2. Equinus deformity of foot     Plan:  Patient was evaluated and treated and all questions answered.  Post-operative State -Continue normal shoegear -Recc icing for any additional discomfort. -F/u if issues persist.  Return if symptoms worsen or fail to improve.

## 2020-12-30 ENCOUNTER — Other Ambulatory Visit: Payer: Self-pay

## 2020-12-30 ENCOUNTER — Ambulatory Visit
Admission: EM | Admit: 2020-12-30 | Discharge: 2020-12-30 | Disposition: A | Discharge status: Home / Self Care | Payer: BC Managed Care – PPO | Attending: Emergency Medicine | Admitting: Emergency Medicine

## 2020-12-30 DIAGNOSIS — A084 Viral intestinal infection, unspecified: Secondary | ICD-10-CM | POA: Diagnosis not present

## 2020-12-30 MED ORDER — LOPERAMIDE HCL 2 MG PO CAPS
2.0000 mg | ORAL_CAPSULE | Freq: Two times a day (BID) | ORAL | 0 refills | Status: DC
Start: 2020-12-30 — End: 2023-05-11

## 2020-12-30 MED ORDER — ONDANSETRON HCL 4 MG PO TABS: 4.0000 mg | ORAL_TABLET | Freq: Four times a day (QID) | ORAL | 0 refills | Status: DC | PRN

## 2020-12-30 NOTE — ED Triage Notes (Signed)
Pt c/o nauseous, vomiting x1, and diarrhea yesterday. States able to eat and drink ok today. Feels much better today. States needs work note.

## 2020-12-30 NOTE — Discharge Instructions (Addendum)
our symptoms are most likely caused by food poisoning or a virus, either way it will work its way out your systemj over the next few days  You can use zofran every 6 hours as needed for nausea, be mindful this medication may make you drowsy, take the first dose at home to see how it affects your body  You can use Imodium twice a day once in the morning once in the evening and food to help with diarrhea, and be mindful over use of this medication may cause opposite effect constipation  You can use over-the-counter ibuprofen or Tylenol, which ever you have at home, to help manage fevers  Continue to promote hydration throughout the day by using electrolyte replacement solution such as Gatorade, body armor, Pedialyte, which ever you have at home

## 2020-12-30 NOTE — ED Provider Notes (Addendum)
[facility]    CSN: [id number] Arrival date & time: 12/30/20  1048      History   Chief Complaint Chief Complaint  Patient presents with   Emesis    HPI Destiny Skinner is a 35 y.o. female.   Patient presents with nausea, vomiting and diarrhea beginning upon awakening yesterday morning.  Ate a steak the night prior with no issue.  Symptoms have resolved.  Able to tolerate breakfast this morning.  Denies abdominal pain, fevers, chills, body aches, URI symptoms, urinary involvement, recent travel.  Family member in household with similar symptoms.  Vaccinated.  Past Medical History:  Diagnosis Date   Seasonal allergies     Patient Active Problem List   Diagnosis Date Noted   Obesity, unspecified 05/11/2013    Past Surgical History:  Procedure Laterality Date   TONSILLECTOMY  1993   WISDOM TOOTH EXTRACTION      OB History   No obstetric history on file.      Home Medications    Prior to Admission medications   Medication Sig Start Date End Date Taking? Authorizing Provider  loperamide (IMODIUM) 2 MG capsule Take 1 capsule (2 mg total) by mouth in the morning and at bedtime. 12/30/20  Yes Seann Genther R, NP  Azelastine HCl 0.15 % SOLN Place 2 sprays into both nostrils 2 (two) times daily. 10/10/17   Porfirio Oar, PA  cephALEXin (KEFLEX) 500 MG capsule Take 1 capsule (500 mg total) by mouth 2 (two) times daily. 09/02/20   Park Liter, DPM  cetirizine (ZYRTEC) 10 MG tablet Take 10 mg by mouth daily.    [provider]  diclofenac (VOLTAREN) 75 MG EC tablet Take 1 tablet (75 mg total) by mouth 2 (two) times daily. 01/10/20   Peyton Najjar, MD  fluticasone (FLONASE) 50 MCG/ACT nasal spray Place 2 sprays into both nostrils daily. 09/22/17   Doristine Bosworth, MD  Multiple Vitamin (MULTIVITAMIN) capsule Take 1 capsule by mouth daily.    [provider]  naproxen sodium (ALEVE) 220 MG tablet Take 220 mg by mouth.    [provider]  ondansetron (ZOFRAN) 4 MG tablet Take 1 tablet (4 mg total) by mouth every 6 (six) hours as needed for nausea or vomiting. 12/30/20   Valinda Hoar, NP  oxyCODONE-acetaminophen (PERCOCET) 5-325 MG tablet Take 1 tablet by mouth every 4 (four) hours as needed for severe pain. 09/02/20   Park Liter, DPM  triamcinolone (KENALOG) 0.1 % Apply 1 application topically 2 (two) times daily. 08/18/20   Georgetta Haber, NP    Family History Family History  Problem Relation Age of Onset   Diabetes Mother    Hyperlipidemia Mother    Hypertension Mother    Hyperlipidemia Father    Hypertension Father    Heart disease Maternal Grandmother    Heart disease Maternal Grandfather    Diabetes Paternal Grandmother    Cancer Paternal Grandfather     Social History Social History   Tobacco Use   Smoking status: Never   Smokeless tobacco: Never  Substance Use Topics   Alcohol use: No    Alcohol/week: 0.0 standard drinks   Drug use: No     Allergies   Patient has no known allergies.   Review of Systems Review of Systems  Constitutional: Negative.   HENT: Negative.    Respiratory: Negative.    Cardiovascular: Negative.   Gastrointestinal:  Positive for diarrhea, nausea and vomiting. Negative for  abdominal distention, abdominal pain, anal bleeding, blood in stool, constipation and rectal pain.  Genitourinary: Negative.   Skin: Negative.     Physical Exam Triage Vital Signs ED Triage Vitals [12/30/20 1102]  Enc Vitals Group     BP 119/79     Pulse Rate 79     Resp 18     Temp 98.2 F (36.8 C)     Temp Source Oral     SpO2 96 %     Weight      Height      Head Circumference      Peak Flow      Pain Score 0     Pain Loc      Pain Edu?      Excl. in [place]?    No data found.  Updated Vital Signs BP 119/79 (BP Location: Left Arm)   Pulse 79   Temp 98.2 F (36.8 C) (Oral)   Resp 18   LMP 12/21/2020   SpO2 96%   Visual Acuity Right Eye Distance:   Left Eye  Distance:   Bilateral Distance:    Right Eye Near:   Left Eye Near:    Bilateral Near:     Physical Exam Constitutional:      Appearance: Normal appearance. She is normal weight.  HENT:     Head: Normocephalic.  Eyes:     Extraocular Movements: Extraocular movements intact.  Pulmonary:     Effort: Pulmonary effort is normal.  Abdominal:     General: Abdomen is flat. Bowel sounds are normal. There is no distension.     Tenderness: There is no abdominal tenderness. There is no right CVA tenderness, left CVA tenderness or guarding.  Musculoskeletal:        General: Normal range of motion.  Skin:    General: Skin is warm and dry.  Neurological:     Mental Status: She is alert and oriented to person, place, and time. Mental status is at baseline.  Psychiatric:        Mood and Affect: Mood normal.        Behavior: Behavior normal.     UC Treatments / Results  Labs (all labs ordered are listed, but only abnormal results are displayed) Labs Reviewed - No data to display  EKG   Radiology No results found.  Procedures Procedures (including critical care time)  Medications Ordered in UC Medications - No data to display  Initial Impression / Assessment and Plan / UC Course  I have reviewed the triage vital signs and the nursing notes.  Pertinent labs & imaging results that were available during my care of the patient were reviewed by me and considered in my medical decision making (see chart for details).  Viral gastroenteritis  Virus versus food poisoning for etiology of symptoms, discussed with patient, discussed symptoms should resolve spontaneously  1.  Zofran 4 mg every 6 hours. 2.  Imodium 2 mg twice daily as needed 3. Advised increase fluid intake to promote hydration 4.  Declining COVID test Final Clinical Impressions(s) / UC Diagnoses   Final diagnoses:  Viral gastroenteritis     Discharge Instructions      our symptoms are most likely caused by  food poisoning or a virus, either way it will work its way out your systemj over the next few days  You can use zofran every 6 hours as needed for nausea, be mindful this medication may make you drowsy, take the first dose  at home to see how it affects your body  You can use Imodium twice a day once in the morning once in the evening and food to help with diarrhea, and be mindful over use of this medication may cause opposite effect constipation  You can use over-the-counter ibuprofen or Tylenol, which ever you have at home, to help manage fevers  Continue to promote hydration throughout the day by using electrolyte replacement solution such as Gatorade, body armor, Pedialyte, which ever you have at home   ED Prescriptions     Medication Sig Dispense Auth. Provider   ondansetron (ZOFRAN) 4 MG tablet Take 1 tablet (4 mg total) by mouth every 6 (six) hours as needed for nausea or vomiting. 20 tablet Zoii Florer, Hansel Starling R, NP   loperamide (IMODIUM) 2 MG capsule Take 1 capsule (2 mg total) by mouth in the morning and at bedtime. 12 capsule Valinda Hoar, NP      PDMP not reviewed this encounter.   Valinda Hoar, NP 12/30/20 1135    Valinda Hoar, NP 12/30/20 1135

## 2021-08-18 ENCOUNTER — Other Ambulatory Visit: Payer: Self-pay

## 2021-08-18 ENCOUNTER — Ambulatory Visit
Admission: EM | Admit: 2021-08-18 | Discharge: 2021-08-18 | Disposition: A | Discharge status: Home / Self Care | Payer: BC Managed Care – PPO | Attending: Physician Assistant | Admitting: Physician Assistant

## 2021-08-18 DIAGNOSIS — H6123 Impacted cerumen, bilateral: Secondary | ICD-10-CM | POA: Diagnosis not present

## 2021-08-18 NOTE — ED Triage Notes (Signed)
Pt reports that she got water in her right ear last night and feels like her hearing is muffled. No left ear sxs. ?Has been trying to clean ears with a towel and q-tips. ?

## 2021-08-18 NOTE — ED Provider Notes (Signed)
?[place] ? ? ? ?CSN: [id number] ?Arrival date & time: 08/18/21  1010 ? ? ?  ? ?History   ?Chief Complaint ?Chief Complaint  ?Patient presents with  ? Ear Fullness  ?  right  ? ? ?HPI ?Destiny Skinner is a 36 y.o. female.  ? ?Patient here today for evaluation of decreased hearing to her right ear after she accidentally got water in her ear and tried to clean out with a towel and q tips. She does not report any pain. She has not had any significant congestion, fever, or other concerns.  ? ?The history is provided by the patient.  ? ?Past Medical History:  ?Diagnosis Date  ? Seasonal allergies   ? ? ?Patient Active Problem List  ? Diagnosis Date Noted  ? Obesity, unspecified 05/11/2013  ? ? ?Past Surgical History:  ?Procedure Laterality Date  ? TONSILLECTOMY  1993  ? WISDOM TOOTH EXTRACTION    ? ? ?OB History   ?No obstetric history on file. ?  ? ? ? ?Home Medications   ? ?Prior to Admission medications   ?Medication Sig Start Date End Date Taking? Authorizing Provider  ?Azelastine HCl 0.15 % SOLN Place 2 sprays into both nostrils 2 (two) times daily. 10/10/17   Harrison Mons, PA  ?cephALEXin (KEFLEX) 500 MG capsule Take 1 capsule (500 mg total) by mouth 2 (two) times daily. 09/02/20   Evelina Bucy, DPM  ?cetirizine (ZYRTEC) 10 MG tablet Take 10 mg by mouth daily.    [provider]  ?diclofenac (VOLTAREN) 75 MG EC tablet Take 1 tablet (75 mg total) by mouth 2 (two) times daily. 01/10/20   Posey Boyer, MD  ?fluticasone Asencion Islam) 50 MCG/ACT nasal spray Place 2 sprays into both nostrils daily. 09/22/17   Forrest Moron, MD  ?loperamide (IMODIUM) 2 MG capsule Take 1 capsule (2 mg total) by mouth in the morning and at bedtime. 12/30/20   Hans Eden, NP  ?Multiple Vitamin (MULTIVITAMIN) capsule Take 1 capsule by mouth daily.    [provider]  ?naproxen sodium (ALEVE) 220 MG tablet Take 220 mg by mouth.    [provider]  ?ondansetron (ZOFRAN) 4 MG tablet Take 1  tablet (4 mg total) by mouth every 6 (six) hours as needed for nausea or vomiting. 12/30/20   Hans Eden, NP  ?oxyCODONE-acetaminophen (PERCOCET) 5-325 MG tablet Take 1 tablet by mouth every 4 (four) hours as needed for severe pain. 09/02/20   Evelina Bucy, DPM  ?triamcinolone (KENALOG) 0.1 % Apply 1 application topically 2 (two) times daily. 08/18/20   Zigmund Gottron, NP  ? ? ?Family History ?Family History  ?Problem Relation Age of Onset  ? Diabetes Mother   ? Hyperlipidemia Mother   ? Hypertension Mother   ? Hyperlipidemia Father   ? Hypertension Father   ? Heart disease Maternal Grandmother   ? Heart disease Maternal Grandfather   ? Diabetes Paternal Grandmother   ? Cancer Paternal Grandfather   ? ? ?Social History ?Social History  ? ?Tobacco Use  ? Smoking status: Never  ? Smokeless tobacco: Never  ?Substance Use Topics  ? Alcohol use: No  ?  Alcohol/week: 0.0 standard drinks  ? Drug use: No  ? ? ? ?Allergies   ?Patient has no known allergies. ? ? ?Review of Systems ?Review of Systems  ?Constitutional:  Negative for chills and fever.  ?HENT:  Positive for hearing loss. Negative for congestion, ear pain and sore throat.   ?  Eyes:  Negative for discharge and redness.  ?Respiratory:  Negative for cough, shortness of breath and wheezing.   ?Gastrointestinal:  Negative for abdominal pain, diarrhea, nausea and vomiting.  ? ? ?Physical Exam ?Triage Vital Signs ?ED Triage Vitals  ?Enc Vitals Group  ?   BP   ?   Pulse   ?   Resp   ?   Temp   ?   Temp src   ?   SpO2   ?   Weight   ?   Height   ?   Head Circumference   ?   Peak Flow   ?   Pain Score   ?   Pain Loc   ?   Pain Edu?   ?   Excl. in [place]?   ? ?No data found. ? ?Updated Vital Signs ?BP 135/83 (BP Location: Left Arm)   Pulse 64   Temp 98.1 ?F (36.7 ?C) (Oral)   Resp 18   SpO2 99%  ? ?Physical Exam ?Vitals and nursing note reviewed.  ?Constitutional:   ?   General: She is not in acute distress. ?   Appearance: Normal appearance. She is not  ill-appearing.  ?HENT:  ?   Head: Normocephalic and atraumatic.  ?   Right Ear: There is impacted cerumen.  ?   Left Ear: There is impacted cerumen.  ?Eyes:  ?   Conjunctiva/sclera: Conjunctivae normal.  ?Cardiovascular:  ?   Rate and Rhythm: Normal rate.  ?Pulmonary:  ?   Effort: Pulmonary effort is normal.  ?Neurological:  ?   Mental Status: She is alert.  ?Psychiatric:     ?   Mood and Affect: Mood normal.     ?   Behavior: Behavior normal.     ?   Thought Content: Thought content normal.  ? ? ? ?UC Treatments / Results  ?Labs ?(all labs ordered are listed, but only abnormal results are displayed) ?Labs Reviewed - No data to display ? ?EKG ? ? ?Radiology ?No results found. ? ?Procedures ?Procedures (including critical care time) ? ?Medications Ordered in UC ?Medications - No data to display ? ?Initial Impression / Assessment and Plan / UC Course  ?I have reviewed the triage vital signs and the nursing notes. ? ?Pertinent labs & imaging results that were available during my care of the patient were reviewed by me and considered in my medical decision making (see chart for details). ? ?  ?Bilateral ears irrigated in office with success. Encouraged follow up if symptoms are not improving with treatment or with any further concerns.  ? ?Final Clinical Impressions(s) / UC Diagnoses  ? ?Final diagnoses:  ?Hearing loss due to cerumen impaction, bilateral  ? ?Discharge Instructions   ?None ?  ? ?ED Prescriptions   ?None ?  ? ?PDMP not reviewed this encounter. ?  ?Francene Finders, PA-C ?08/18/21 1211 ? ?

## 2021-10-06 ENCOUNTER — Encounter: Payer: Self-pay | Admitting: Emergency Medicine

## 2021-10-06 ENCOUNTER — Ambulatory Visit
Admission: EM | Admit: 2021-10-06 | Discharge: 2021-10-06 | Disposition: A | Discharge status: Home / Self Care | Payer: BC Managed Care – PPO | Attending: Physician Assistant | Admitting: Physician Assistant

## 2021-10-06 DIAGNOSIS — J069 Acute upper respiratory infection, unspecified: Secondary | ICD-10-CM

## 2021-10-06 DIAGNOSIS — R0981 Nasal congestion: Secondary | ICD-10-CM | POA: Diagnosis not present

## 2021-10-06 DIAGNOSIS — R051 Acute cough: Secondary | ICD-10-CM

## 2021-10-06 MED ORDER — PROMETHAZINE-DM 6.25-15 MG/5ML PO SYRP: 5.0000 mL | ORAL_SOLUTION | Freq: Four times a day (QID) | ORAL | 0 refills | Status: DC | PRN

## 2021-10-06 NOTE — Discharge Instructions (Signed)
I believe that you have a virus.  We are swabbing you for flu and COVID.  Please monitor your MyChart for these results.  You should be out of work until you receive your COVID results.  Continue with your allergy medication as previously prescribed.  Use nasal saline for additional symptom relief.  I have called in Promethazine DM for cough.  This will make you sleepy so do not drive or drink alcohol while taking it.  Make sure you rest and drink plenty of fluid.  If anything worsens and you develop high fever not responding to medication, chest pain, shortness of breath, nausea/vomiting interfering with oral intake, weakness you need to be seen immediately. ?

## 2021-10-06 NOTE — ED Triage Notes (Addendum)
Pt said x 2 day has noticed nasal drainage, headache with pain in top of head. Pt said drainage is clear, drainage down the back of her throat. No fevers today Pt did 2 home covid test and both negative ?

## 2021-10-06 NOTE — ED Provider Notes (Signed)
?[place] ? ? ? ?CSN: [id number] ?Arrival date & time: 10/06/21  1830 ? ? ?  ? ?History   ?Chief Complaint ?Chief Complaint  ?Patient presents with  ? Nasal Congestion  ? Headache  ? ? ?HPI ?Destiny Skinner is a 36 y.o. female.  ? ?Patient presents today with a 2-day history of URI symptoms.  She reports nasal congestion, cough, fatigue, malaise, subjective fever.  Denies chest pain, shortness of breath, headache, dizziness, nausea/vomiting, diarrhea, abdominal pain.  Has been taking Zyrtec for allergies.  Denies history of asthma, COPD, smoking.  Has had COVID several years ago.  Has had COVID-vaccine.  Has tried over-the-counter Alka-Seltzer plus without improvement of symptoms but was able to sleep as result of medication.  Denies any recent antibiotics.  Denies significant past medical history including diabetes, immunosuppression, chronic liver/kidney disease, heart disease. ? ? ?Past Medical History:  ?Diagnosis Date  ? Seasonal allergies   ? ? ?Patient Active Problem List  ? Diagnosis Date Noted  ? Obesity, unspecified 05/11/2013  ? ? ?Past Surgical History:  ?Procedure Laterality Date  ? TONSILLECTOMY  1993  ? WISDOM TOOTH EXTRACTION    ? ? ?OB History   ?No obstetric history on file. ?  ? ? ? ?Home Medications   ? ?Prior to Admission medications   ?Medication Sig Start Date End Date Taking? Authorizing Provider  ?promethazine-dextromethorphan (PROMETHAZINE-DM) 6.25-15 MG/5ML syrup Take 5 mLs by mouth 4 (four) times daily as needed for cough. 10/06/21  Yes Asif Muchow, Derry Skill, PA-C  ?Azelastine HCl 0.15 % SOLN Place 2 sprays into both nostrils 2 (two) times daily. 10/10/17   Harrison Mons, PA  ?cetirizine (ZYRTEC) 10 MG tablet Take 10 mg by mouth daily.    [provider]  ?diclofenac (VOLTAREN) 75 MG EC tablet Take 1 tablet (75 mg total) by mouth 2 (two) times daily. 01/10/20   Posey Boyer, MD  ?fluticasone Asencion Islam) 50 MCG/ACT nasal spray Place 2 sprays into both nostrils daily.  09/22/17   Forrest Moron, MD  ?loperamide (IMODIUM) 2 MG capsule Take 1 capsule (2 mg total) by mouth in the morning and at bedtime. 12/30/20   Hans Eden, NP  ?Multiple Vitamin (MULTIVITAMIN) capsule Take 1 capsule by mouth daily.    [provider]  ?naproxen sodium (ALEVE) 220 MG tablet Take 220 mg by mouth.    [provider]  ?ondansetron (ZOFRAN) 4 MG tablet Take 1 tablet (4 mg total) by mouth every 6 (six) hours as needed for nausea or vomiting. 12/30/20   Hans Eden, NP  ?oxyCODONE-acetaminophen (PERCOCET) 5-325 MG tablet Take 1 tablet by mouth every 4 (four) hours as needed for severe pain. 09/02/20   Evelina Bucy, DPM  ?triamcinolone (KENALOG) 0.1 % Apply 1 application topically 2 (two) times daily. 08/18/20   Zigmund Gottron, NP  ? ? ?Family History ?Family History  ?Problem Relation Age of Onset  ? Diabetes Mother   ? Hyperlipidemia Mother   ? Hypertension Mother   ? Hyperlipidemia Father   ? Hypertension Father   ? Heart disease Maternal Grandmother   ? Heart disease Maternal Grandfather   ? Diabetes Paternal Grandmother   ? Cancer Paternal Grandfather   ? ? ?Social History ?Social History  ? ?Tobacco Use  ? Smoking status: Never  ? Smokeless tobacco: Never  ?Substance Use Topics  ? Alcohol use: No  ?  Alcohol/week: 0.0 standard drinks  ? Drug use: No  ? ? ? ?  Allergies   ?Patient has no known allergies. ? ? ?Review of Systems ?Review of Systems  ?Constitutional:  Positive for activity change, chills, fatigue and fever. Negative for appetite change.  ?HENT:  Positive for congestion, postnasal drip and sinus pressure. Negative for sneezing and sore throat.   ?Respiratory:  Positive for cough. Negative for shortness of breath.   ?Cardiovascular:  Negative for chest pain.  ?Gastrointestinal:  Negative for abdominal pain, diarrhea, nausea and vomiting.  ?Neurological:  Negative for dizziness, light-headedness and headaches.  ? ? ?Physical Exam ?Triage Vital Signs ?ED Triage  Vitals [10/06/21 1841]  ?Enc Vitals Group  ?   BP (!) 141/79  ?   Pulse Rate (!) 105  ?   Resp 18  ?   Temp 98 ?F (36.7 ?C)  ?   Temp Source Oral  ?   SpO2 95 %  ?   Weight   ?   Height   ?   Head Circumference   ?   Peak Flow   ?   Pain Score   ?   Pain Loc   ?   Pain Edu?   ?   Excl. in [place]?   ? ?No data found. ? ?Updated Vital Signs ?BP (!) 141/79 (BP Location: Left Arm)   Pulse (!) 105   Temp 98 ?F (36.7 ?C) (Oral)   Resp 18   SpO2 95%  ? ?Visual Acuity ?Right Eye Distance:   ?Left Eye Distance:   ?Bilateral Distance:   ? ?Right Eye Near:   ?Left Eye Near:    ?Bilateral Near:    ? ?Physical Exam ?Vitals reviewed.  ?Constitutional:   ?   General: She is awake. She is not in acute distress. ?   Appearance: Normal appearance. She is well-developed. She is not ill-appearing.  ?   Comments: Very pleasant female appears stated age in no acute distress sitting comfortably in exam room  ?HENT:  ?   Head: Normocephalic and atraumatic.  ?   Right Ear: Tympanic membrane, ear canal and external ear normal. Tympanic membrane is not erythematous or bulging.  ?   Left Ear: Tympanic membrane, ear canal and external ear normal. Tympanic membrane is not erythematous or bulging.  ?   Nose:  ?   Right Sinus: No maxillary sinus tenderness or frontal sinus tenderness.  ?   Left Sinus: No maxillary sinus tenderness or frontal sinus tenderness.  ?   Mouth/Throat:  ?   Pharynx: Uvula midline. Posterior oropharyngeal erythema present. No oropharyngeal exudate.  ?Cardiovascular:  ?   Rate and Rhythm: Normal rate and regular rhythm.  ?   Heart sounds: Normal heart sounds, S1 normal and S2 normal. No murmur heard. ?Pulmonary:  ?   Effort: Pulmonary effort is normal.  ?   Breath sounds: Normal breath sounds. No wheezing, rhonchi or rales.  ?   Comments: Clear to auscultation bilaterally ?Psychiatric:     ?   Behavior: Behavior is cooperative.  ? ? ? ?UC Treatments / Results  ?Labs ?(all labs ordered are listed, but only abnormal results  are displayed) ?Labs Reviewed  ?COVID-19, FLU A+B NAA  ? ? ?EKG ? ? ?Radiology ?No results found. ? ?Procedures ?Procedures (including critical care time) ? ?Medications Ordered in UC ?Medications - No data to display ? ?Initial Impression / Assessment and Plan / UC Course  ?I have reviewed the triage vital signs and the nursing notes. ? ?Pertinent labs & imaging results that were available  during my care of the patient were reviewed by me and considered in my medical decision making (see chart for details). ? ?  ? ?No evidence of acute infection on physical exam that would warrant initiation of antibiotics.  Suspect viral etiology of symptoms.  Patient was tested for COVID-19 and flu.  She is outside the window of effectiveness for Tamiflu.  If positive for COVID could consider antiviral medication based on history of obesity but otherwise does not have significant risk factors for severe disease so is unlikely to significantly benefit from these medications.  If interested, we could prescribe Paxlovid but would need to update her kidney function.  Recommended conservative treatment measures in the meantime including rest and drinking plenty of fluid.  She is to use Tylenol ibuprofen for pain.  Recommended Mucinex and Flonase for congestion.  She was prescribed Promethazine DM for cough with instruction not to drive or drink alcohol taking this medication as drowsiness is a common side effect.  Discussed that if she has any worsening symptoms including high fever, chest pain, shortness of breath, nausea/vomiting, weakness, lethargy, worsening cough she needs to be seen immediately to which she expressed understanding.  Work excuse note with current CDC return to work guidelines provided. ? ?Final Clinical Impressions(s) / UC Diagnoses  ? ?Final diagnoses:  ?Upper respiratory tract infection, unspecified type  ?Nasal congestion  ?Acute cough  ? ? ? ?Discharge Instructions   ? ?  ?I believe that you have a virus.  We  are swabbing you for flu and COVID.  Please monitor your MyChart for these results.  You should be out of work until you receive your COVID results.  Continue with your allergy medication as previously prescribe

## 2021-10-08 LAB — COVID-19, FLU A+B NAA
Influenza A, NAA: NOT DETECTED
Influenza B, NAA: NOT DETECTED
SARS-CoV-2, NAA: NOT DETECTED

## 2022-02-03 ENCOUNTER — Ambulatory Visit
Admission: EM | Admit: 2022-02-03 | Discharge: 2022-02-03 | Disposition: A | Discharge status: Home / Self Care | Payer: BC Managed Care – PPO | Attending: Internal Medicine | Admitting: Internal Medicine

## 2022-02-03 DIAGNOSIS — Z20822 Contact with and (suspected) exposure to covid-19: Secondary | ICD-10-CM | POA: Diagnosis present

## 2022-02-03 DIAGNOSIS — J069 Acute upper respiratory infection, unspecified: Secondary | ICD-10-CM | POA: Diagnosis present

## 2022-02-03 LAB — SARS CORONAVIRUS 2 BY RT PCR: SARS Coronavirus 2 by RT PCR: POSITIVE — AB

## 2022-02-03 NOTE — Discharge Instructions (Signed)
You likely having a viral upper respiratory infection. We recommended symptom control. I expect your symptoms to start improving in the next 1-2 weeks.   1. Take a daily allergy pill/anti-histamine like Zyrtec, Claritin, or Store brand consistently for 2 weeks  2. For congestion you may try an oral decongestant like Mucinex or sudafed. You may also try intranasal flonase nasal spray or saline irrigations (neti pot, sinus cleanse)  3. For your sore throat you may try cepacol lozenges, salt water gargles, throat spray. Treatment of congestion may also help your sore throat.  4. For cough you may try Robitussen, Mucinex DM  5. Take Tylenol or Ibuprofen to help with pain/inflammation  6. Stay hydrated, drink plenty of fluids to keep throat coated and less irritated  Honey Tea For cough/sore throat try using a honey-based tea. Use 3 teaspoons of honey with juice squeezed from half lemon. Place shaved pieces of ginger into 1/2-1 cup of water and warm over stove top. Then mix the ingredients and repeat every 4 hours as needed.   COVID test is pending.  We will call if it is positive.  Please follow-up if any symptoms persist or worsen.

## 2022-02-03 NOTE — ED Triage Notes (Signed)
Pt c/o dry/sore throat, headache, nasal drainage  Onset ~ yesterday   Reports covid exposure via husband

## 2022-02-03 NOTE — ED Provider Notes (Signed)
[facility]    CSN: [id number] Arrival date & time: 02/03/22  8786      History   Chief Complaint Chief Complaint  Patient presents with   Sore Throat    HPI Destiny Skinner is a 36 y.o. female.   Patient presents with sore throat, headache, nasal drainage, mild nonproductive cough that started yesterday into this morning upon awakening.  Patient reports that her husband tested positive for COVID-19 yesterday.  Patient denies any known fevers.  Patient has taken ibuprofen for symptoms with some improvement.  Denies chest pain, shortness of breath, nausea, vomiting, diarrhea, abdominal pain.   Sore Throat    Past Medical History:  Diagnosis Date   Seasonal allergies     Patient Active Problem List   Diagnosis Date Noted   Obesity, unspecified 05/11/2013    Past Surgical History:  Procedure Laterality Date   TONSILLECTOMY  1993   WISDOM TOOTH EXTRACTION      OB History   No obstetric history on file.      Home Medications    Prior to Admission medications   Medication Sig Start Date End Date Taking? Authorizing Provider  Azelastine HCl 0.15 % SOLN Place 2 sprays into both nostrils 2 (two) times daily. 10/10/17   Porfirio Oar, PA  cetirizine (ZYRTEC) 10 MG tablet Take 10 mg by mouth daily.    [provider]  diclofenac (VOLTAREN) 75 MG EC tablet Take 1 tablet (75 mg total) by mouth 2 (two) times daily. 01/10/20   Peyton Najjar, MD  fluticasone (FLONASE) 50 MCG/ACT nasal spray Place 2 sprays into both nostrils daily. 09/22/17   Doristine Bosworth, MD  loperamide (IMODIUM) 2 MG capsule Take 1 capsule (2 mg total) by mouth in the morning and at bedtime. 12/30/20   Valinda Hoar, NP  Multiple Vitamin (MULTIVITAMIN) capsule Take 1 capsule by mouth daily.    [provider]  naproxen sodium (ALEVE) 220 MG tablet Take 220 mg by mouth.    [provider]  ondansetron (ZOFRAN) 4 MG tablet Take 1 tablet (4 mg total) by mouth  every 6 (six) hours as needed for nausea or vomiting. 12/30/20   Valinda Hoar, NP  oxyCODONE-acetaminophen (PERCOCET) 5-325 MG tablet Take 1 tablet by mouth every 4 (four) hours as needed for severe pain. 09/02/20   Park Liter, DPM  promethazine-dextromethorphan (PROMETHAZINE-DM) 6.25-15 MG/5ML syrup Take 5 mLs by mouth 4 (four) times daily as needed for cough. 10/06/21   Raspet, Denny Peon K, PA-C  triamcinolone (KENALOG) 0.1 % Apply 1 application topically 2 (two) times daily. 08/18/20   Georgetta Haber, NP    Family History Family History  Problem Relation Age of Onset   Diabetes Mother    Hyperlipidemia Mother    Hypertension Mother    Hyperlipidemia Father    Hypertension Father    Heart disease Maternal Grandmother    Heart disease Maternal Grandfather    Diabetes Paternal Grandmother    Cancer Paternal Grandfather     Social History Social History   Tobacco Use   Smoking status: Never   Smokeless tobacco: Never  Substance Use Topics   Alcohol use: No    Alcohol/week: 0.0 standard drinks of alcohol   Drug use: No     Allergies   Patient has no known allergies.   Review of Systems Review of Systems Per HPI  Physical Exam Triage Vital Signs ED Triage Vitals [02/03/22 0835]  Enc Vitals Group  BP (!) 141/84     Pulse Rate 74     Resp 20     Temp 98.1 F (36.7 C)     Temp Source Oral     SpO2 98 %     Weight      Height      Head Circumference      Peak Flow      Pain Score 7     Pain Loc      Pain Edu?      Excl. in GC?    No data found.  Updated Vital Signs BP (!) 141/84 (BP Location: Left Arm)   Pulse 74   Temp 98.1 F (36.7 C) (Oral)   Resp 20   SpO2 98%   Visual Acuity Right Eye Distance:   Left Eye Distance:   Bilateral Distance:    Right Eye Near:   Left Eye Near:    Bilateral Near:     Physical Exam Constitutional:      General: She is not in acute distress.    Appearance: Normal appearance. She is not toxic-appearing or  diaphoretic.  HENT:     Head: Normocephalic and atraumatic.     Right Ear: Tympanic membrane and ear canal normal.     Left Ear: Tympanic membrane and ear canal normal.     Nose: Congestion present.     Mouth/Throat:     Mouth: Mucous membranes are moist.     Pharynx: No posterior oropharyngeal erythema.  Eyes:     Extraocular Movements: Extraocular movements intact.     Conjunctiva/sclera: Conjunctivae normal.     Pupils: Pupils are equal, round, and reactive to light.  Cardiovascular:     Rate and Rhythm: Normal rate and regular rhythm.     Pulses: Normal pulses.     Heart sounds: Normal heart sounds.  Pulmonary:     Effort: Pulmonary effort is normal. No respiratory distress.     Breath sounds: Normal breath sounds. No stridor. No wheezing, rhonchi or rales.  Abdominal:     General: Abdomen is flat. Bowel sounds are normal.     Palpations: Abdomen is soft.  Musculoskeletal:        General: Normal range of motion.     Cervical back: Normal range of motion.  Skin:    General: Skin is warm and dry.  Neurological:     General: No focal deficit present.     Mental Status: She is alert and oriented to person, place, and time. Mental status is at baseline.  Psychiatric:        Mood and Affect: Mood normal.        Behavior: Behavior normal.      UC Treatments / Results  Labs (all labs ordered are listed, but only abnormal results are displayed) Labs Reviewed  SARS CORONAVIRUS 2 BY RT PCR    EKG   Radiology No results found.  Procedures Procedures (including critical care time)  Medications Ordered in UC Medications - No data to display  Initial Impression / Assessment and Plan / UC Course  I have reviewed the triage vital signs and the nursing notes.  Pertinent labs & imaging results that were available during my care of the patient were reviewed by me and considered in my medical decision making (see chart for details).     Patient presents with symptoms  likely from a viral upper respiratory infection. Differential includes bacterial pneumonia, sinusitis, allergic rhinitis, COVID-19, flu. Do not  suspect underlying cardiopulmonary process. Symptoms seem unlikely related to ACS, CHF or COPD exacerbations, pneumonia, pneumothorax. Patient is nontoxic appearing and not in need of emergent medical intervention.  Highly suspicious of COVID-19 given patient's close exposure.  COVID test pending.  Recommended symptom control with over the counter medications: Daily oral anti-histamine, Oral decongestant or IN corticosteroid, saline irrigations, cepacol lozenges, Robitussin, Delsym, honey tea.  Discussed COVID antivirals with patient but patient declined COVID antivirals with shared decision making.  Return if symptoms fail to improve in 1-2 weeks or you develop shortness of breath, chest pain, severe headache. Patient states understanding and is agreeable.  Discharged with PCP followup.  Final Clinical Impressions(s) / UC Diagnoses   Final diagnoses:  Viral upper respiratory tract infection with cough  Close exposure to COVID-19 virus     Discharge Instructions      You likely having a viral upper respiratory infection. We recommended symptom control. I expect your symptoms to start improving in the next 1-2 weeks.   1. Take a daily allergy pill/anti-histamine like Zyrtec, Claritin, or Store brand consistently for 2 weeks  2. For congestion you may try an oral decongestant like Mucinex or sudafed. You may also try intranasal flonase nasal spray or saline irrigations (neti pot, sinus cleanse)  3. For your sore throat you may try cepacol lozenges, salt water gargles, throat spray. Treatment of congestion may also help your sore throat.  4. For cough you may try Robitussen, Mucinex DM  5. Take Tylenol or Ibuprofen to help with pain/inflammation  6. Stay hydrated, drink plenty of fluids to keep throat coated and less irritated  Honey Tea For  cough/sore throat try using a honey-based tea. Use 3 teaspoons of honey with juice squeezed from half lemon. Place shaved pieces of ginger into 1/2-1 cup of water and warm over stove top. Then mix the ingredients and repeat every 4 hours as needed.   COVID test is pending.  We will call if it is positive.  Please follow-up if any symptoms persist or worsen.    ED Prescriptions   None    PDMP not reviewed this encounter.   Gustavus Bryant, [place] 02/03/22 [phone]

## 2023-05-09 ENCOUNTER — Ambulatory Visit
Admission: EM | Admit: 2023-05-09 | Discharge: 2023-05-09 | Disposition: A | Discharge status: Home / Self Care | Payer: BC Managed Care – PPO | Attending: Family Medicine | Admitting: Family Medicine

## 2023-05-09 ENCOUNTER — Encounter: Payer: Self-pay | Admitting: Emergency Medicine

## 2023-05-09 ENCOUNTER — Other Ambulatory Visit: Payer: Self-pay

## 2023-05-09 DIAGNOSIS — J069 Acute upper respiratory infection, unspecified: Secondary | ICD-10-CM | POA: Diagnosis not present

## 2023-05-09 LAB — POC COVID19/FLU A&B COMBO
Covid Antigen, POC: NEGATIVE
Influenza A Antigen, POC: NEGATIVE
Influenza B Antigen, POC: NEGATIVE

## 2023-05-09 MED ORDER — BENZONATATE 200 MG PO CAPS: 200.0000 mg | ORAL_CAPSULE | Freq: Three times a day (TID) | ORAL | 0 refills | Status: DC | PRN

## 2023-05-09 NOTE — Discharge Instructions (Signed)
You were seen today for upper respiratory symptoms.  Your flu and covid were negative today.  This is likely viral.  I recommend you get plenty of rest, and increase fluids.  You may use tylenol, motrin, and warm salt water gargles.  I recommend over the counter zyrtec/claritin for sinus congestion and drainage.  I have sent out a medication to help with the cough.  If you have worsening symptoms that are not improving then please return for re-evaluation.

## 2023-05-09 NOTE — ED Provider Notes (Signed)
[facility]    CSN: [id number] Arrival date & time: 05/09/23  0809      History   Chief Complaint Chief Complaint  Patient presents with   Cough    HPI Destiny Skinner is a 37 y.o. female.    Cough Associated symptoms: rhinorrhea and sore throat   Associated symptoms: no shortness of breath and no wheezing    Patient is here for URI symptoms x 2 days.  Woke up with sore throat yesterday, and then with runny nose, cough, headache.  Having facial pain at the right sinus area.  Her husband was dx with bronchitis several days ago.  No fevers.  No wheezing or sob.  No otc meds taken.  No home covid testing.        Past Medical History:  Diagnosis Date   Seasonal allergies     Patient Active Problem List   Diagnosis Date Noted   Obesity, unspecified 05/11/2013    Past Surgical History:  Procedure Laterality Date   TONSILLECTOMY  1993   WISDOM TOOTH EXTRACTION      OB History   No obstetric history on file.      Home Medications    Prior to Admission medications   Medication Sig Start Date End Date Taking? Authorizing Provider  Azelastine HCl 0.15 % SOLN Place 2 sprays into both nostrils 2 (two) times daily. 10/10/17   Porfirio Oar, PA  cetirizine (ZYRTEC) 10 MG tablet Take 10 mg by mouth daily.    [provider]  diclofenac (VOLTAREN) 75 MG EC tablet Take 1 tablet (75 mg total) by mouth 2 (two) times daily. 01/10/20   Peyton Najjar, MD  fluticasone (FLONASE) 50 MCG/ACT nasal spray Place 2 sprays into both nostrils daily. 09/22/17   Doristine Bosworth, MD  loperamide (IMODIUM) 2 MG capsule Take 1 capsule (2 mg total) by mouth in the morning and at bedtime. 12/30/20   Valinda Hoar, NP  Multiple Vitamin (MULTIVITAMIN) capsule Take 1 capsule by mouth daily.    [provider]  naproxen sodium (ALEVE) 220 MG tablet Take 220 mg by mouth.    [provider]  ondansetron (ZOFRAN) 4 MG tablet Take 1 tablet (4 mg  total) by mouth every 6 (six) hours as needed for nausea or vomiting. 12/30/20   Valinda Hoar, NP  oxyCODONE-acetaminophen (PERCOCET) 5-325 MG tablet Take 1 tablet by mouth every 4 (four) hours as needed for severe pain. 09/02/20   Park Liter, DPM  promethazine-dextromethorphan (PROMETHAZINE-DM) 6.25-15 MG/5ML syrup Take 5 mLs by mouth 4 (four) times daily as needed for cough. 10/06/21   Raspet, Denny Peon K, PA-C  triamcinolone (KENALOG) 0.1 % Apply 1 application topically 2 (two) times daily. 08/18/20   Georgetta Haber, NP    Family History Family History  Problem Relation Age of Onset   Diabetes Mother    Hyperlipidemia Mother    Hypertension Mother    Hyperlipidemia Father    Hypertension Father    Heart disease Maternal Grandmother    Heart disease Maternal Grandfather    Diabetes Paternal Grandmother    Cancer Paternal Grandfather     Social History Social History   Tobacco Use   Smoking status: Never   Smokeless tobacco: Never  Substance Use Topics   Alcohol use: No    Alcohol/week: 0.0 standard drinks of alcohol   Drug use: No     Allergies   Patient has no known allergies.   Review  of Systems Review of Systems  Constitutional: Negative.   HENT:  Positive for rhinorrhea and sore throat.   Respiratory:  Positive for cough. Negative for shortness of breath and wheezing.   Cardiovascular: Negative.   Gastrointestinal: Negative.   Musculoskeletal: Negative.   Psychiatric/Behavioral: Negative.       Physical Exam Triage Vital Signs ED Triage Vitals [05/09/23 0910]  Encounter Vitals Group     BP (!) 156/84     Systolic BP Percentile      Diastolic BP Percentile      Pulse Rate 85     Resp 18     Temp 98.6 F (37 C)     Temp Source Oral     SpO2 96 %     Weight      Height      Head Circumference      Peak Flow      Pain Score 4     Pain Loc      Pain Education      Exclude from Growth Chart    No data found.  Updated Vital Signs BP (!)  156/84 (BP Location: Left Arm)   Pulse 85   Temp 98.6 F (37 C) (Oral)   Resp 18   SpO2 96%   Visual Acuity Right Eye Distance:   Left Eye Distance:   Bilateral Distance:    Right Eye Near:   Left Eye Near:    Bilateral Near:     Physical Exam Constitutional:      General: She is not in acute distress.    Appearance: Normal appearance. She is not ill-appearing.  HENT:     Nose: Congestion present. No rhinorrhea.     Mouth/Throat:     Mouth: Mucous membranes are moist.     Pharynx: No oropharyngeal exudate or posterior oropharyngeal erythema.  Cardiovascular:     Rate and Rhythm: Normal rate and regular rhythm.  Pulmonary:     Effort: Pulmonary effort is normal.     Breath sounds: Normal breath sounds.  Musculoskeletal:     Cervical back: Normal range of motion and neck supple.  Skin:    General: Skin is warm.  Neurological:     General: No focal deficit present.     Mental Status: She is alert.  Psychiatric:        Mood and Affect: Mood normal.      UC Treatments / Results  Labs (all labs ordered are listed, but only abnormal results are displayed) Labs Reviewed  POC COVID19/FLU A&B COMBO - Normal    EKG   Radiology No results found.  Procedures Procedures (including critical care time)  Medications Ordered in UC Medications - No data to display  Initial Impression / Assessment and Plan / UC Course  I have reviewed the triage vital signs and the nursing notes.  Pertinent labs & imaging results that were available during my care of the patient were reviewed by me and considered in my medical decision making (see chart for details).   Final Clinical Impressions(s) / UC Diagnoses   Final diagnoses:  Acute upper respiratory infection     Discharge Instructions      You were seen today for upper respiratory symptoms.  Your flu and covid were negative today.  This is likely viral.  I recommend you get plenty of rest, and increase fluids.  You  may use tylenol, motrin, and warm salt water gargles.  I recommend over the counter zyrtec/claritin  for sinus congestion and drainage.  I have sent out a medication to help with the cough.  If you have worsening symptoms that are not improving then please return for re-evaluation.     ED Prescriptions     Medication Sig Dispense Auth. Provider   benzonatate (TESSALON) 200 MG capsule Take 1 capsule (200 mg total) by mouth 3 (three) times daily as needed for cough. 21 capsule Jannifer Franklin, MD      PDMP not reviewed this encounter.   Jannifer Franklin, MD 05/09/23 [phone]

## 2023-05-09 NOTE — ED Triage Notes (Signed)
Pt sts cough and nasal congestion with scratchy throat x 2 days; pt sts husband has bronchitis and she feels like she may have same

## 2023-05-11 ENCOUNTER — Ambulatory Visit: Payer: BC Managed Care – PPO | Admitting: Family Medicine

## 2023-05-11 ENCOUNTER — Encounter: Payer: Self-pay | Admitting: Family Medicine

## 2023-05-11 VITALS — BP 128/78 | HR 76 | Temp 99.4°F | Resp 18 | Ht 67.0 in | Wt 270.9 lb

## 2023-05-11 DIAGNOSIS — J40 Bronchitis, not specified as acute or chronic: Secondary | ICD-10-CM | POA: Insufficient documentation

## 2023-05-11 MED ORDER — PREDNISONE 10 MG PO TABS: 10.0000 mg | ORAL_TABLET | Freq: Two times a day (BID) | ORAL | 0 refills | Status: DC

## 2023-05-11 NOTE — Patient Instructions (Signed)
Viral illnesses can take up to 10 days to resolve.   There is no role for an antibiotic.  Symptomatic treatment is ideal.   Take over the counter pain medication as needed. Acetaminophen and ibuprofen for fever and body aches. Mucinex and Robitussin for cough, if you have high blood pressure, take Coricidin for cough. Honey is also effective for cough, avoid if diabetic. Flonase or saline nasal spray for nasal congestion.    Read and follow instructions on the label and make sure not to combine other medications that may have same ingredients in it. It is important to not take too much.    Drink plenty of caffeine-free fluids. (If you have heart or kidney problems, follow the instructions of your specialist regarding amounts). Adequate fluids will help you to avoid dehydration.   If you are hungry, eat a bland diet, such as the BRAT diet (bananas, rice, applesauce, toast). Diet as tolerated if appetite is normal.   Get lots of rest.  Let us know if you are not improving or getting worse.      Prednisone warning: Prednisone is an anti-inflammatory medication. It is best to only use it for short periods of time. It can cause  your blood pressure to increase and it can cause your blood sugar to increase. If you are a diabetic, please check your blood sugar twice per day while you are taking prednisone. If your sugars are > 200 or < 70 please seek medical attention right away.

## 2023-05-11 NOTE — Assessment & Plan Note (Signed)
Nasal congestion, cough, headache since Sunday. Started with sore throat which has resolved. Went to urgent care on 12/16 with negative Covid and flu. Husband sick with bronchitis. Taking Nyquil for cough which has been effected.  Has low grade fever in office, otherwise she has been afebrile. No shortness of breath or wheezing. Lungs are clear. Vital signs are stable. Prednisone 10 mg BID with food for next 5 days. Continue with supportive therapy. Follow-up if symptoms do not improve.

## 2023-05-11 NOTE — Progress Notes (Signed)
New Patient Office Visit  Subjective    Patient ID: Destiny Skinner, female    DOB: [birth year]  Age: 37 y.o. MRN: [id number]  CC:  Chief Complaint  Patient presents with   URI    Nasal congestion, cough, headache. Sunday morning     HPI Destiny Skinner presents to establish care with this practice. She is new to me.  Presents today to establish care and for an acute visit.    Reports nasal congestion, cough, headache on Sunday morning. Sore throat at first, resolved now. Nasal drainage clear.  Took Nyquil which has helped.  Denies fever.  No shortness of breath.  Urgent care tested for Covid and influenza on 05/09/23.  Husband sick with bronchitis.   History and medications reviewed.     Outpatient Encounter Medications as of 05/11/2023  Medication Sig   predniSONE (DELTASONE) 10 MG tablet Take 1 tablet (10 mg total) by mouth 2 (two) times daily with a meal.   benzonatate (TESSALON) 200 MG capsule Take 1 capsule (200 mg total) by mouth 3 (three) times daily as needed for cough.   cetirizine (ZYRTEC) 10 MG tablet Take 10 mg by mouth daily.   Multiple Vitamin (MULTIVITAMIN) capsule Take 1 capsule by mouth daily.   [DISCONTINUED] Azelastine HCl 0.15 % SOLN Place 2 sprays into both nostrils 2 (two) times daily.   [DISCONTINUED] diclofenac (VOLTAREN) 75 MG EC tablet Take 1 tablet (75 mg total) by mouth 2 (two) times daily.   [DISCONTINUED] fluticasone (FLONASE) 50 MCG/ACT nasal spray Place 2 sprays into both nostrils daily.   [DISCONTINUED] loperamide (IMODIUM) 2 MG capsule Take 1 capsule (2 mg total) by mouth in the morning and at bedtime.   [DISCONTINUED] naproxen sodium (ALEVE) 220 MG tablet Take 220 mg by mouth.   [DISCONTINUED] ondansetron (ZOFRAN) 4 MG tablet Take 1 tablet (4 mg total) by mouth every 6 (six) hours as needed for nausea or vomiting.   [DISCONTINUED] oxyCODONE-acetaminophen (PERCOCET) 5-325 MG tablet Take 1 tablet by mouth every 4 (four) hours as needed for  severe pain.   [DISCONTINUED] promethazine-dextromethorphan (PROMETHAZINE-DM) 6.25-15 MG/5ML syrup Take 5 mLs by mouth 4 (four) times daily as needed for cough.   [DISCONTINUED] triamcinolone (KENALOG) 0.1 % Apply 1 application topically 2 (two) times daily.   No facility-administered encounter medications on file as of 05/11/2023.    Past Medical History:  Diagnosis Date   Seasonal allergies     Past Surgical History:  Procedure Laterality Date   FOOT SURGERY  2022   TONSILLECTOMY  05/25/1991   WISDOM TOOTH EXTRACTION      Family History  Problem Relation Age of Onset   Diabetes Mother    Hyperlipidemia Mother    Hypertension Mother    Hyperlipidemia Father    Hypertension Father    Heart disease Maternal Grandmother    Heart disease Maternal Grandfather    Diabetes Paternal Grandmother    Cancer Paternal Grandfather     Social History   Socioeconomic History   Marital status: Married    Spouse name: Not on file   Number of children: 0   Years of education: Not on file   Highest education level: Not on file  Occupational History   Not on file  Tobacco Use   Smoking status: Never   Smokeless tobacco: Never  Substance and Sexual Activity   Alcohol use: No    Alcohol/week: 0.0 standard drinks of alcohol   Drug use: No   Sexual activity: Never  Birth control/protection: None  Other Topics Concern   Not on file  Social History Narrative   Not on file   Social Drivers of Health   Financial Resource Strain: Not on file  Food Insecurity: Not on file  Transportation Needs: Not on file  Physical Activity: Not on file  Stress: Not on file  Social Connections: Not on file  Intimate Partner Violence: Not on file    Review of Systems  Constitutional:  Negative for chills and fever.  HENT:  Positive for congestion. Negative for ear pain, sinus pain and sore throat.   Respiratory:  Negative for shortness of breath and wheezing.   Cardiovascular:  Negative for  chest pain.  Gastrointestinal:  Negative for abdominal pain, nausea and vomiting.  Neurological:  Positive for headaches.  Psychiatric/Behavioral:  Negative for depression and suicidal ideas. The patient is not nervous/anxious.         Objective    BP 128/78 (BP Location: Right Arm, Patient Position: Sitting, Cuff Size: Large)   Pulse 76   Temp 99.4 F (37.4 C)   Resp 18   Ht 5\' 7"  (1.702 m)   Wt 270 lb 14.4 oz (122.9 kg)   SpO2 95%   BMI 42.43 kg/m   Physical Exam Vitals and nursing note reviewed.  Constitutional:      General: She is not in acute distress.    Appearance: Normal appearance. She is obese. She is ill-appearing.  HENT:     Right Ear: Tympanic membrane normal.     Left Ear: Tympanic membrane normal.     Nose: Congestion present.     Right Sinus: No maxillary sinus tenderness or frontal sinus tenderness.     Left Sinus: No maxillary sinus tenderness or frontal sinus tenderness.     Mouth/Throat:     Pharynx: Uvula midline. Posterior oropharyngeal erythema present. No oropharyngeal exudate.  Cardiovascular:     Rate and Rhythm: Normal rate and regular rhythm.     Pulses: Normal pulses.     Heart sounds: Normal heart sounds.  Pulmonary:     Effort: Pulmonary effort is normal.     Breath sounds: Normal breath sounds.  Skin:    General: Skin is warm and dry.  Neurological:     General: No focal deficit present.     Mental Status: She is alert. Mental status is at baseline.  Psychiatric:        Mood and Affect: Mood normal.        Behavior: Behavior normal.        Thought Content: Thought content normal.        Judgment: Judgment normal.       Flowsheet Row Office Visit from 05/11/2023 in [facility]  PHQ-9 Total Score 0         05/11/2023    1:12 PM  GAD 7 : Generalized Anxiety Score  Nervous, Anxious, on Edge 0  Control/stop worrying 0  Worry too much - different things 0  Trouble relaxing 0  Restless 0   Easily annoyed or irritable 0  Afraid - awful might happen 0  Total GAD 7 Score 0  Anxiety Difficulty Not difficult at all     Assessment & Plan:   Problem List Items Addressed This Visit     Bronchitis - Primary   Nasal congestion, cough, headache since Sunday. Started with sore throat which has resolved. Went to urgent care on 12/16 with negative Covid and  flu. Husband sick with bronchitis. Taking Nyquil for cough which has been effected.  Has low grade fever in office, otherwise she has been afebrile. No shortness of breath or wheezing. Lungs are clear. Vital signs are stable. Prednisone 10 mg BID with food for next 5 days. Continue with supportive therapy. Follow-up if symptoms do not improve.       Relevant Medications   predniSONE (DELTASONE) 10 MG tablet  Agrees with plan of care discussed.  Questions answered.   Return in about 23 days (around 06/03/2023) for CPE with labs with pap .   Novella Olive, FNP

## 2023-06-03 ENCOUNTER — Encounter: Payer: Self-pay | Admitting: Family Medicine

## 2023-06-03 ENCOUNTER — Ambulatory Visit (INDEPENDENT_AMBULATORY_CARE_PROVIDER_SITE_OTHER): Payer: BC Managed Care – PPO | Admitting: Family Medicine

## 2023-06-03 ENCOUNTER — Other Ambulatory Visit: Payer: Self-pay | Admitting: Family Medicine

## 2023-06-03 VITALS — BP 131/85 | HR 80 | Resp 14 | Ht 67.0 in | Wt 271.2 lb

## 2023-06-03 DIAGNOSIS — Z1159 Encounter for screening for other viral diseases: Secondary | ICD-10-CM | POA: Insufficient documentation

## 2023-06-03 DIAGNOSIS — Z13228 Encounter for screening for other metabolic disorders: Secondary | ICD-10-CM | POA: Insufficient documentation

## 2023-06-03 DIAGNOSIS — Z124 Encounter for screening for malignant neoplasm of cervix: Secondary | ICD-10-CM | POA: Insufficient documentation

## 2023-06-03 DIAGNOSIS — Z136 Encounter for screening for cardiovascular disorders: Secondary | ICD-10-CM

## 2023-06-03 DIAGNOSIS — R21 Rash and other nonspecific skin eruption: Secondary | ICD-10-CM | POA: Insufficient documentation

## 2023-06-03 DIAGNOSIS — Z0000 Encounter for general adult medical examination without abnormal findings: Secondary | ICD-10-CM

## 2023-06-03 DIAGNOSIS — Z13220 Encounter for screening for lipoid disorders: Secondary | ICD-10-CM | POA: Diagnosis not present

## 2023-06-03 DIAGNOSIS — Z1329 Encounter for screening for other suspected endocrine disorder: Secondary | ICD-10-CM

## 2023-06-03 NOTE — Progress Notes (Addendum)
 Complete physical exam  Patient: Destiny Skinner   DOB: 10-09-1985   38 y.o. Female  MRN: [id number]  Subjective:    Chief Complaint  Patient presents with   Labs Only   Annual Exam    With pap smear.    Destiny Skinner is a 38 y.o. female who presents today for a complete physical exam. She reports consuming a general diet.  walks  She generally feels well. She reports sleeping well. She does not have additional problems to discuss today.    Most recent fall risk assessment:    05/11/2023    1:11 PM  Fall Risk   Falls in the past year? 0  Number falls in past yr: 0  Injury with Fall? 0  Risk for fall due to : No Fall Risks     Most recent depression screenings:    05/11/2023    1:11 PM 01/10/2020   12:19 PM  PHQ 2/9 Scores  PHQ - 2 Score 0 0  PHQ- 9 Score 0     Vision:Not within last year  and Dental: No current dental problems and No regular dental care   Past Medical History:  Diagnosis Date   Seasonal allergies       Patient Care Team: Pcp, No as PCP - General   Outpatient Medications Prior to Visit  Medication Sig   cetirizine (ZYRTEC) 10 MG tablet Take 10 mg by mouth daily.   Multiple Vitamin (MULTIVITAMIN) capsule Take 1 capsule by mouth daily.   benzonatate  (TESSALON ) 200 MG capsule Take 1 capsule (200 mg total) by mouth 3 (three) times daily as needed for cough. (Patient not taking: Reported on 06/03/2023)   predniSONE  (DELTASONE ) 10 MG tablet Take 1 tablet (10 mg total) by mouth 2 (two) times daily with a meal. (Patient not taking: Reported on 06/03/2023)   No facility-administered medications prior to visit.    ROS        Objective:     BP 131/85 (BP Location: Left Arm, Patient Position: Sitting)   Pulse 80   Resp 14   Ht 5' 7 (1.702 m)   Wt 271 lb 3.2 oz (123 kg)   SpO2 100%   BMI 42.48 kg/m  BP Readings from Last 3 Encounters:  06/03/23 131/85  05/11/23 128/78  05/09/23 (!) 156/84      Physical Exam Vitals and nursing  note reviewed. Exam conducted with a chaperone present.  Constitutional:      General: She is not in acute distress.    Appearance: Normal appearance.  HENT:     Right Ear: Tympanic membrane normal.     Left Ear: Tympanic membrane normal.     Nose: Nose normal.     Mouth/Throat:     Mouth: Mucous membranes are moist.     Pharynx: Oropharynx is clear.  Eyes:     Extraocular Movements: Extraocular movements intact.  Neck:     Thyroid: No thyroid tenderness.  Cardiovascular:     Rate and Rhythm: Normal rate and regular rhythm.     Pulses:          Radial pulses are 2+ on the right side and 2+ on the left side.     Heart sounds: Normal heart sounds, S1 normal and S2 normal.  Pulmonary:     Effort: Pulmonary effort is normal.     Breath sounds: Normal breath sounds.  Chest:  Breasts:    Right: Normal. No bleeding, inverted nipple, nipple discharge, skin  change or tenderness.     Left: Normal. No bleeding, inverted nipple, nipple discharge, skin change or tenderness.  Abdominal:     General: Bowel sounds are normal.     Palpations: Abdomen is soft.     Tenderness: There is no abdominal tenderness.  Genitourinary:    General: Normal vulva.     Exam position: Lithotomy position.     Vagina: Normal.     Cervix: Normal.     Adnexa: Right adnexa normal and left adnexa normal.  Musculoskeletal:        General: Normal range of motion.     Cervical back: Normal range of motion.     Right lower leg: No edema.     Left lower leg: No edema.  Lymphadenopathy:     Cervical:     Right cervical: No superficial cervical adenopathy.    Left cervical: No superficial cervical adenopathy.     Upper Body:     Right upper body: No supraclavicular or axillary adenopathy.     Left upper body: No supraclavicular or axillary adenopathy.  Skin:    General: Skin is warm and dry.  Neurological:     General: No focal deficit present.     Mental Status: She is alert. Mental status is at baseline.   Psychiatric:        Mood and Affect: Mood normal.        Behavior: Behavior normal.        Thought Content: Thought content normal.        Judgment: Judgment normal.      No results found for any visits on 06/03/23.     Assessment & Plan:    Routine Health Maintenance and Physical Exam  Immunization History  Administered Date(s) Administered   PFIZER(Purple Top)SARS-COV-2 Vaccination 09/07/2019, 10/02/2019    Health Maintenance  Topic Date Due   HIV Screening  Never done   Hepatitis C Screening  Never done   DTaP/Tdap/Td (1 - Tdap) Never done   Cervical Cancer Screening (HPV/Pap Cotest)  05/11/2016   INFLUENZA VACCINE  Never done   COVID-19 Vaccine (3 - 2024-25 season) 01/23/2023   HPV VACCINES  Aged Out    Discussed health benefits of physical activity, and encouraged her to engage in regular exercise appropriate for her age and condition.  Annual physical exam -     CBC with Differential/Platelet -     Comprehensive metabolic panel  Encounter for lipid screening for cardiovascular disease -     Lipid panel  Need for hepatitis C screening test -     Hepatitis C antibody  Screening for viral disease -     HIV Antibody (routine testing w rflx)  Screening for thyroid disorder -     TSH + free T4  Encounter for screening for metabolic disorder -     Comprehensive metabolic panel -     Hemoglobin A1c -     TSH + free T4  Screening for cervical cancer -     IGP, Aptima HPV  Rash and nonspecific skin eruption -     Ambulatory referral to Dermatology      Routine labs ordered.  HCM reviewed/discussed. Pap smear, Hep C, and HIV today.  Anticipatory guidance regarding healthy weight, lifestyle and choices given. Recommend healthy diet.  Recommend approximately 150 minutes/week of moderate intensity exercise. Resistance training is good for building muscles and for bone health. Muscle mass helps to increase our metabolism and to burn more  calories at rest.   Limit alcohol consumption: no more than one drink per day for women and 2 drinks per day for me. Recommend regular dental and vision exams. Always use seatbelt/lap and shoulder restraints. Recommend using smoke alarms and checking batteries at least twice a year. Recommend using sunscreen when outside.  Please know that I am here to help you with all of your health care goals and am happy to work with you to find a solution that works best for you.  The greatest advice I have received with any changes in life are to take it one step at a time, that even means if all you can focus on is the next 60 seconds, then do that and celebrate your victories.  With any changes in life, you will have set backs, and that is OK. The important thing to remember is, if you have a set back, it is not a failure, it is an opportunity to try again! Agrees with plan of care discussed.  Questions answered.      Return in about 1 year (around 06/02/2024) for CPE with labs.     Darice JONELLE Brownie, FNP

## 2023-06-03 NOTE — Addendum Note (Signed)
 Addended by: Novella Olive on: 06/03/2023 09:15 AM   Modules accepted: Orders

## 2023-06-06 ENCOUNTER — Other Ambulatory Visit: Payer: Self-pay | Admitting: Family Medicine

## 2023-06-06 LAB — LIPID PANEL
Chol/HDL Ratio: 2.9 {ratio} (ref 0.0–4.4)
Cholesterol, Total: 148 mg/dL (ref 100–199)
HDL: 51 mg/dL (ref >39)
LDL Chol Calc (NIH): 84 mg/dL (ref 0–99)
Triglycerides: 61 mg/dL (ref 0–149)
VLDL Cholesterol Cal: 13 mg/dL (ref 5–40)

## 2023-06-06 LAB — COMPREHENSIVE METABOLIC PANEL
ALT: 25 [IU]/L (ref 0–32)
AST: 20 [IU]/L (ref 0–40)
Albumin: 4.1 g/dL (ref 3.9–4.9)
Alkaline Phosphatase: 91 [IU]/L (ref 44–121)
BUN/Creatinine Ratio: 20 (ref 9–23)
BUN: 17 mg/dL (ref 6–20)
Bilirubin Total: 0.3 mg/dL (ref 0.0–1.2)
CO2: 23 mmol/L (ref 20–29)
Calcium: 9 mg/dL (ref 8.7–10.2)
Chloride: 105 mmol/L (ref 96–106)
Creatinine, Ser: 0.83 mg/dL (ref 0.57–1.00)
Globulin, Total: 3 g/dL (ref 1.5–4.5)
Glucose: 101 mg/dL — ABNORMAL HIGH (ref 70–99)
Potassium: 4.3 mmol/L (ref 3.5–5.2)
Sodium: 142 mmol/L (ref 134–144)
Total Protein: 7.1 g/dL (ref 6.0–8.5)
eGFR: 92 mL/min/{1.73_m2} (ref >59)

## 2023-06-06 LAB — HEPATITIS C ANTIBODY: Hep C Virus Ab: NONREACTIVE

## 2023-06-06 LAB — CBC WITH DIFFERENTIAL/PLATELET
Basophils Absolute: 0.1 10*3/uL (ref 0.0–0.2)
Basos: 1 %
EOS (ABSOLUTE): 0.1 10*3/uL (ref 0.0–0.4)
Eos: 3 %
Hematocrit: 41.1 % (ref 34.0–46.6)
Hemoglobin: 13.3 g/dL (ref 11.1–15.9)
Immature Grans (Abs): 0 10*3/uL (ref 0.0–0.1)
Immature Granulocytes: 0 %
Lymphocytes Absolute: 1.9 10*3/uL (ref 0.7–3.1)
Lymphs: 37 %
MCH: 28.9 pg (ref 26.6–33.0)
MCHC: 32.4 g/dL (ref 31.5–35.7)
MCV: 89 fL (ref 79–97)
Monocytes Absolute: 0.4 10*3/uL (ref 0.1–0.9)
Monocytes: 8 %
Neutrophils Absolute: 2.6 10*3/uL (ref 1.4–7.0)
Neutrophils: 51 %
Platelets: 330 10*3/uL (ref 150–450)
RBC: 4.61 x10E6/uL (ref 3.77–5.28)
RDW: 14.1 % (ref 11.7–15.4)
WBC: 5.1 10*3/uL (ref 3.4–10.8)

## 2023-06-06 LAB — HEMOGLOBIN A1C
Est. average glucose Bld gHb Est-mCnc: 120 mg/dL
Hgb A1c MFr Bld: 5.8 % — ABNORMAL HIGH (ref 4.8–5.6)

## 2023-06-06 LAB — TSH+FREE T4
Free T4: 1.28 ng/dL (ref 0.82–1.77)
TSH: 0.591 u[IU]/mL (ref 0.450–4.500)

## 2023-06-06 LAB — HIV ANTIBODY (ROUTINE TESTING W REFLEX): HIV Screen 4th Generation wRfx: NONREACTIVE

## 2023-06-07 ENCOUNTER — Encounter: Payer: Self-pay | Admitting: Family Medicine

## 2023-06-13 ENCOUNTER — Encounter: Payer: Self-pay | Admitting: Family Medicine

## 2023-06-13 LAB — IGP, APTIMA HPV
HPV Aptima: NEGATIVE
PAP Smear Comment: 0

## 2023-06-20 ENCOUNTER — Ambulatory Visit: Payer: Self-pay | Admitting: Family Medicine

## 2023-06-20 ENCOUNTER — Ambulatory Visit (INDEPENDENT_AMBULATORY_CARE_PROVIDER_SITE_OTHER): Payer: BC Managed Care – PPO | Admitting: Family Medicine

## 2023-06-20 ENCOUNTER — Encounter: Payer: Self-pay | Admitting: Family Medicine

## 2023-06-20 VITALS — BP 137/84 | HR 120 | Temp 101.4°F | Resp 18 | Ht 67.0 in | Wt 270.0 lb

## 2023-06-20 DIAGNOSIS — R509 Fever, unspecified: Secondary | ICD-10-CM | POA: Diagnosis not present

## 2023-06-20 DIAGNOSIS — J101 Influenza due to other identified influenza virus with other respiratory manifestations: Secondary | ICD-10-CM

## 2023-06-20 LAB — POCT INFLUENZA A/B
Influenza A, POC: POSITIVE — AB
Influenza B, POC: NEGATIVE

## 2023-06-20 LAB — POC COVID19 BINAXNOW: SARS Coronavirus 2 Ag: NEGATIVE

## 2023-06-20 MED ORDER — OSELTAMIVIR PHOSPHATE 75 MG PO CAPS: 75.0000 mg | ORAL_CAPSULE | Freq: Two times a day (BID) | ORAL | 0 refills | Status: AC

## 2023-06-20 NOTE — Telephone Encounter (Signed)
Chief Complaint: Dry Cough Symptoms: Dry cough, fever, chills, runny nose, body aches, headache Frequency: constant  Pertinent Negatives: Patient denies vomiting, chest pain, SOB Disposition: [] ED /[] Urgent Care (no appt availability in office) / [x] Appointment(In office/virtual)/ []  [facility]/ [] Home Care/ [] Refused Recommended Disposition /[] [facility]/ []  Follow-up with PCP Additional Notes: Patient states she has had symptoms since Saturday night. Patient reports taking OTC treatment like dyquil and nightquil, tylenol and cough drops to treat symptoms without much improvement. Care advice was given and patient has been scheduled for an appointment today at 1430.  Patient states a lot of her coworkers have been sick recently. Copied from CRM [id number]. Topic: Clinical - Red Word Triage >> Jun 20, 2023  8:45 AM Louie Casa B wrote: [facility] that prompted transfer to Nurse Triage: patient has body aches fever and a cough Reason for Disposition  [1] Continuous (nonstop) coughing interferes with work or school AND [2] no improvement using cough treatment per Care Advice  Answer Assessment - Initial Assessment Questions 1. ONSET: "When did the cough begin?"      Saturday night 2. SEVERITY: "How bad is the cough today?"      Mild  3. SPUTUM: "Describe the color of your sputum" (none, dry cough; clear, white, yellow, green)     Dry cough 4. HEMOPTYSIS: "Are you coughing up any blood?" If so ask: "How much?" (flecks, streaks, tablespoons, etc.)     No 5. DIFFICULTY BREATHING: "Are you having difficulty breathing?" If Yes, ask: "How bad is it?" (e.g., mild, moderate, severe)    - MILD: No SOB at rest, mild SOB with walking, speaks normally in sentences, can lie down, no retractions, pulse < 100.    - MODERATE: SOB at rest, SOB with minimal exertion and prefers to sit, cannot lie down flat, speaks in phrases, mild retractions, audible wheezing, pulse 100-120.    - SEVERE: Very  SOB at rest, speaks in single words, struggling to breathe, sitting hunched forward, retractions, pulse > 120      No  6. FEVER: "Do you have a fever?" If Yes, ask: "What is your temperature, how was it measured, and when did it start?"     Last night my temp was 100.3 7. CARDIAC HISTORY: "Do you have any history of heart disease?" (e.g., heart attack, congestive heart failure)      No  8. LUNG HISTORY: "Do you have any history of lung disease?"  (e.g., pulmonary embolus, asthma, emphysema)     No 9. PE RISK FACTORS: "Do you have a history of blood clots?" (or: recent major surgery, recent prolonged travel, bedridden)     No  10. OTHER SYMPTOMS: "Do you have any other symptoms?" (e.g., runny nose, wheezing, chest pain)       Chills, fever, body aches, headache, runny nose, sore throat 12. TRAVEL: "Have you traveled out of the country in the last month?" (e.g., travel history, exposures)       No  Protocols used: Cough - Acute Non-Productive-A-AH

## 2023-06-20 NOTE — Progress Notes (Signed)
Acute Office Visit  Subjective:     Patient ID: Destiny Skinner, female    DOB: 1985-06-07, 38 y.o.   MRN: [id number]  No chief complaint on file.   Cough Associated symptoms include chills and a fever.  URI  Associated symptoms include coughing.  Patient is in today for acute visit.  Pt reports Saturday, she started having itchy throat. She reports there was sick contacts at work that day. She says yesterday she started having aches. She has had fever since yesterday. She has taken Ibuprofen since her symptoms. Her T max was 100.3. She denies sore throat but has popping in her ears. She has had rhinorrhea with dry cough. She has had headaches along with body aches with chills.   Review of Systems  Constitutional:  Positive for chills and fever.  Respiratory:  Positive for cough.   All other systems reviewed and are negative.      Objective:    There were no vitals taken for this visit. BP Readings from Last 3 Encounters:  06/20/23 137/84  06/03/23 131/85  05/11/23 128/78      Physical Exam Vitals and nursing note reviewed.  Constitutional:      Appearance: Normal appearance. She is normal weight.  HENT:     Head: Normocephalic and atraumatic.     Right Ear: External ear normal.     Left Ear: External ear normal.     Nose: Nose normal.     Mouth/Throat:     Mouth: Mucous membranes are moist.     Pharynx: Oropharynx is clear.  Eyes:     Conjunctiva/sclera: Conjunctivae normal.     Pupils: Pupils are equal, round, and reactive to light.  Cardiovascular:     Rate and Rhythm: Normal rate and regular rhythm.     Pulses: Normal pulses.     Heart sounds: Normal heart sounds.  Pulmonary:     Effort: Pulmonary effort is normal.     Breath sounds: Normal breath sounds.  Skin:    General: Skin is warm.     Capillary Refill: Capillary refill takes less than 2 seconds.  Neurological:     General: No focal deficit present.     Mental Status: She is alert and  oriented to person, place, and time. Mental status is at baseline.  Psychiatric:        Mood and Affect: Mood normal.        Behavior: Behavior normal.        Thought Content: Thought content normal.        Judgment: Judgment normal.   No results found for any visits on 06/20/23.      Assessment & Plan:   Problem List Items Addressed This Visit   None Visit Diagnoses       Fever, unspecified fever cause    -  Primary   Relevant Orders   POC COVID-19 BinaxNow   POCT Influenza A/B     Influenza A -     Oseltamivir Phosphate; Take 1 capsule (75 mg total) by mouth 2 (two) times daily for 5 days.  Dispense: 10 capsule; Refill: 0  Fever, unspecified fever cause -     POC COVID-19 BinaxNow -     POCT Influenza A/B   Flu A +. Treat with Tamiflu 75mg  BID  Work excuse x 5 days Discussed symptomatic care with pt today and handout given.  No orders of the defined types were placed in this encounter.   No  follow-ups on file.  Suzan Slick, MD

## 2023-07-25 ENCOUNTER — Ambulatory Visit: Admitting: Family Medicine

## 2023-07-25 ENCOUNTER — Encounter: Payer: Self-pay | Admitting: Family Medicine

## 2023-07-25 VITALS — BP 142/79 | HR 85 | Temp 98.0°F | Ht 67.0 in | Wt 271.4 lb

## 2023-07-25 DIAGNOSIS — J0100 Acute maxillary sinusitis, unspecified: Secondary | ICD-10-CM | POA: Insufficient documentation

## 2023-07-25 DIAGNOSIS — R0981 Nasal congestion: Secondary | ICD-10-CM | POA: Diagnosis not present

## 2023-07-25 LAB — POC COVID19 BINAXNOW: SARS Coronavirus 2 Ag: NEGATIVE

## 2023-07-25 LAB — POCT INFLUENZA A/B
Influenza A, POC: NEGATIVE
Influenza B, POC: NEGATIVE

## 2023-07-25 NOTE — Patient Instructions (Signed)
   Take over the counter pain medication as needed. Acetaminophen and ibuprofen for fever and body aches. Mucinex and Robitussin for cough, if you have high blood pressure, take Coricidin for cough. Honey is also effective for cough, avoid if diabetic. Flonase or saline nasal spray for nasal congestion.    Read and follow instructions on the label and make sure not to combine other medications that may have same ingredients in it. It is important to not take too much.    Drink plenty of caffeine-free fluids. (If you have heart or kidney problems, follow the instructions of your specialist regarding amounts). Adequate fluids will help you to avoid dehydration.   If you are hungry, eat a bland diet, such as the BRAT diet (bananas, rice, applesauce, toast). Diet as tolerated if appetite is normal.   Get lots of rest.  Let us know if you are not improving or getting worse.

## 2023-07-25 NOTE — Progress Notes (Signed)
 Acute Office Visit  Subjective:     Patient ID: Destiny Skinner, female    DOB: October 26, 1985, 38 y.o.   MRN: [id number]  Chief Complaint  Patient presents with   Nasal Congestion    Nasal congestion/ drainage, headache, sore throat with swallowing , bilateral ear pain with swallowing, x Saturday 07/23/23. Took alka seltzer cold at 11:15 am  and tylenol 500mg  at 8 am today    HPI Patient is in today for nasal congestion, headache, sore throat with swallowing, and bilateral ear pain. Symptoms present since Saturday 07/23/23.  Tried alka seltzer cold and tylenol.  Recent flu A in January. Symptoms resolved after flu.  No sick contacts recently.  Negative Covid and influenza today in the office.  No fever or chills. On doxycycline 100 mg BID per derm.    Review of Systems  Constitutional:  Negative for chills and fever.  HENT:  Positive for congestion, ear pain (with swallowing) and sore throat.   Respiratory:  Negative for cough.         Objective:    BP (!) 142/79   Pulse 85   Temp 98 F (36.7 C)   Ht 5\' 7"  (1.702 m)   Wt 271 lb 7 oz (123.1 kg)   SpO2 99%   BMI 42.51 kg/m  BP Readings from Last 3 Encounters:  07/25/23 (!) 142/79  06/20/23 137/84  06/03/23 131/85      Physical Exam Vitals and nursing note reviewed.  Constitutional:      General: She is not in acute distress.    Appearance: Normal appearance. She is ill-appearing.  HENT:     Right Ear: Tympanic membrane normal.     Left Ear: There is impacted cerumen.     Nose:     Right Sinus: Maxillary sinus tenderness present. No frontal sinus tenderness.     Left Sinus: Maxillary sinus tenderness present. No frontal sinus tenderness.     Mouth/Throat:     Pharynx: Uvula midline. Posterior oropharyngeal erythema present. No pharyngeal swelling or oropharyngeal exudate.  Cardiovascular:     Rate and Rhythm: Regular rhythm.     Heart sounds: Normal heart sounds.  Pulmonary:     Effort: Pulmonary effort is  normal.     Breath sounds: Normal breath sounds.  Skin:    General: Skin is warm and dry.  Neurological:     General: No focal deficit present.     Mental Status: She is alert. Mental status is at baseline.  Psychiatric:        Mood and Affect: Mood normal.        Behavior: Behavior normal.        Thought Content: Thought content normal.        Judgment: Judgment normal.    Results for orders placed or performed in visit on 07/25/23  POC COVID-19  Result Value Ref Range   SARS Coronavirus 2 Ag Negative Negative  POCT Influenza A/B  Result Value Ref Range   Influenza A, POC Negative Negative   Influenza B, POC Negative Negative        Assessment & Plan:   Problem List Items Addressed This Visit     Nasal congestion - Primary   Relevant Orders   POC COVID-19 (Completed)   POCT Influenza A/B (Completed)   Acute non-recurrent maxillary sinusitis   Nasal congestion, headache, sore throat and ear pain with swallowing. Rapid Covid and influenza negative in clinic today.  Symptoms are consistent with  acute sinusitis. She is currently taking doxycycline 100 mg BID per derm. Will not prescribe another antibiotic. Recommend sinus rinses and supportive therapy. Discussed viral illnesses vs. Bacterial illnesses.  Follow-up as needed. May return to work without restrictions unless running a fever.       Relevant Medications   doxycycline (VIBRAMYCIN) 100 MG capsule  Agrees with plan of care discussed.  Questions answered.     Return if symptoms worsen or fail to improve.  Novella Olive, FNP

## 2023-07-25 NOTE — Assessment & Plan Note (Addendum)
 Nasal congestion, headache, sore throat and ear pain with swallowing. Rapid Covid and influenza negative in clinic today.  Symptoms are consistent with acute sinusitis. She is currently taking doxycycline 100 mg BID per derm. Will not prescribe another antibiotic. Recommend sinus rinses and supportive therapy. Discussed viral illnesses vs. Bacterial illnesses.  Follow-up as needed. May return to work without restrictions unless running a fever.

## 2023-08-04 ENCOUNTER — Other Ambulatory Visit (HOSPITAL_COMMUNITY): Payer: Self-pay

## 2023-12-27 ENCOUNTER — Ambulatory Visit
Admission: EM | Admit: 2023-12-27 | Discharge: 2023-12-27 | Disposition: A | Discharge status: Home / Self Care | Attending: Family Medicine | Admitting: Family Medicine

## 2023-12-27 ENCOUNTER — Encounter: Payer: Self-pay | Admitting: Emergency Medicine

## 2023-12-27 DIAGNOSIS — J069 Acute upper respiratory infection, unspecified: Secondary | ICD-10-CM | POA: Diagnosis not present

## 2023-12-27 DIAGNOSIS — U071 COVID-19: Secondary | ICD-10-CM | POA: Diagnosis not present

## 2023-12-27 LAB — POC COVID19/FLU A&B COMBO
Covid Antigen, POC: POSITIVE — AB
Influenza A Antigen, POC: NEGATIVE
Influenza B Antigen, POC: NEGATIVE

## 2023-12-27 MED ORDER — HYDROCODONE BIT-HOMATROP MBR 5-1.5 MG/5ML PO SOLN: 5.0000 mL | Freq: Four times a day (QID) | ORAL | 0 refills | Status: DC | PRN

## 2023-12-27 NOTE — ED Triage Notes (Signed)
 Pt presents c/o sore throat, cough, runny nose, and fever x 2 days. Pt has tried OTC NyQuil to help with sleeping. Pt denies emesis and diarrhea.

## 2023-12-27 NOTE — ED Provider Notes (Signed)
 [facility]   [id number] 12/27/23 Arrival Time: 0950  ASSESSMENT & PLAN:  1. Viral URI   2. COVID-19 virus infection    Discussed typical duration of viral illness. Results for orders placed or performed during the hospital encounter of 12/27/23  POC Covid19/Flu A&B Antigen   Collection Time: 12/27/23 11:21 AM  Result Value Ref Range   Influenza A Antigen, POC Negative Negative   Influenza B Antigen, POC Negative Negative   Covid Antigen, POC Positive (A) Negative   OTC symptom care as needed. Work note provided. Meds ordered this encounter  Medications   HYDROcodone  bit-homatropine (HYCODAN) 5-1.5 MG/5ML syrup    Sig: Take 5 mLs by mouth every 6 (six) hours as needed for cough.    Dispense:  90 mL    Refill:  0     Follow-up Information     Booker Darice SAUNDERS, FNP.   Specialty: Family Medicine Why: As needed. Contact information: [address] [place] [address] [phone]                  Discharge Instructions      Be aware, your cough medication may cause drowsiness. Please do not drive, operate heavy machinery or make important decisions while on this medication, it can cloud your judgement.      Reviewed expectations re: course of current medical issues. Questions answered. Outlined signs and symptoms indicating need for more acute intervention. Understanding verbalized. After Visit Summary given.   SUBJECTIVE: History from: Patient. Destiny Skinner is a 38 y.o. female. Pt presents c/o sore throat, cough, runny nose, and fever x 2 days. Pt has tried OTC NyQuil to help with sleeping. Pt denies emesis and diarrhea.  Denies: difficulty breathing. Normal PO intake without n/v/d.  OBJECTIVE:  Vitals:   12/27/23 1047 12/27/23 1048  BP:  (!) 148/83  Pulse:  86  Resp:  18  Temp:  98.2 F (36.8 C)  TempSrc:  Oral  SpO2:  95%  Weight: 123.1 kg     General appearance: alert; no distress Eyes: PERRLA; EOMI; conjunctiva  normal HENT: [place]; AT; with nasal congestion Neck: supple  Lungs: speaks full sentences without difficulty; unlabored; dry cough Extremities: no edema Skin: warm and dry Neurologic: normal gait Psychological: alert and cooperative; normal mood and affect  Labs: Results for orders placed or performed during the hospital encounter of 12/27/23  POC Covid19/Flu A&B Antigen   Collection Time: 12/27/23 11:21 AM  Result Value Ref Range   Influenza A Antigen, POC Negative Negative   Influenza B Antigen, POC Negative Negative   Covid Antigen, POC Positive (A) Negative   Labs Reviewed  POC COVID19/FLU A&B COMBO - Abnormal; Notable for the following components:      Result Value   Covid Antigen, POC Positive (*)    All other components within normal limits    Imaging: No results found.  No Known Allergies  Past Medical History:  Diagnosis Date   Seasonal allergies    Social History   Socioeconomic History   Marital status: Married    Spouse name: Not on file   Number of children: 0   Years of education: Not on file   Highest education level: Not on file  Occupational History   Not on file  Tobacco Use   Smoking status: Never    Passive exposure: Current   Smokeless tobacco: Never  Vaping Use   Vaping status: Never Used  Substance and Sexual Activity  Alcohol use: No    Alcohol/week: 0.0 standard drinks of alcohol   Drug use: No   Sexual activity: Never    Birth control/protection: None  Other Topics Concern   Not on file  Social History Narrative   Not on file   Social Drivers of Health   Financial Resource Strain: Not on file  Food Insecurity: Not on file  Transportation Needs: Not on file  Physical Activity: Not on file  Stress: Not on file  Social Connections: Not on file  Intimate Partner Violence: Not on file   Family History  Problem Relation Age of Onset   Diabetes Mother    Hyperlipidemia Mother    Hypertension Mother    Hyperlipidemia Father     Hypertension Father    Heart disease Maternal Grandmother    Heart disease Maternal Grandfather    Diabetes Paternal Grandmother    Cancer Paternal Grandfather    Past Surgical History:  Procedure Laterality Date   FOOT SURGERY  2022   TONSILLECTOMY  05/25/1991   WISDOM TOOTH EXTRACTION       Rolinda Rogue, MD 12/27/23 1136

## 2023-12-27 NOTE — Discharge Instructions (Signed)
 Be aware, your cough medication may cause drowsiness. Please do not drive, operate heavy machinery or make important decisions while on this medication, it can cloud your judgement.

## 2023-12-28 ENCOUNTER — Telehealth: Payer: Self-pay

## 2023-12-28 ENCOUNTER — Telehealth (HOSPITAL_COMMUNITY): Payer: Self-pay

## 2023-12-28 MED ORDER — HYDROCODONE BIT-HOMATROP MBR 5-1.5 MG/5ML PO SOLN: 5.0000 mL | Freq: Four times a day (QID) | ORAL | 0 refills | Status: DC | PRN

## 2023-12-28 NOTE — Telephone Encounter (Signed)
 Pt called stating she was not able to fill HYCODAN. The pharmacy did not have anything on file for her. Pt is requesting for it to be sent again. I called the pharmacy to verify, they do not have anything for her to pick up. Please send to [facility] on [address].

## 2023-12-28 NOTE — Telephone Encounter (Signed)
 Resent Rx to [facility] on [address]   Meds ordered this encounter  Medications   HYDROcodone  bit-homatropine (HYCODAN) 5-1.5 MG/5ML syrup    Sig: Take 5 mLs by mouth every 6 (six) hours as needed for cough.    Dispense:  90 mL    Refill:  0

## 2023-12-28 NOTE — Telephone Encounter (Signed)
 Incoming call/information:  A couple who are patients were seen yesterday.  Mrn [id number] Destiny Skinner and spouse. They need their rx sent to a new pharmacy. [facility] on [address]. Please and thanks. :)  Outgoing call/information discussed with patient:  After discussion with provider in clinic today (RSABRA Destiny Skinner), Patient (and spouse) will need to call pharmacy that medication was sent to and check on when it is available to them (if out of stock or needing to be ordered), If that pharmacy that confirmed receipt of the Rx doesn't have it they are required to call around and find a pharmacy that does have it and follow up with provider accordingly and notify patient. Patient will call back to pharmacy and notify them/discuss and follow up with call if needed to Urgent Care.  Destiny Skinner CMA

## 2024-05-18 ENCOUNTER — Other Ambulatory Visit: Payer: Self-pay | Admitting: Family Medicine

## 2024-05-18 ENCOUNTER — Encounter: Payer: Self-pay | Admitting: Emergency Medicine

## 2024-05-18 ENCOUNTER — Ambulatory Visit: Admission: EM | Admit: 2024-05-18 | Discharge: 2024-05-18 | Disposition: A | Discharge status: Home / Self Care

## 2024-05-18 DIAGNOSIS — J101 Influenza due to other identified influenza virus with other respiratory manifestations: Secondary | ICD-10-CM | POA: Diagnosis not present

## 2024-05-18 DIAGNOSIS — Z1231 Encounter for screening mammogram for malignant neoplasm of breast: Secondary | ICD-10-CM

## 2024-05-18 MED ORDER — OSELTAMIVIR PHOSPHATE 75 MG PO CAPS: 75.0000 mg | ORAL_CAPSULE | Freq: Two times a day (BID) | ORAL | 0 refills | Status: DC

## 2024-05-18 NOTE — ED Provider Notes (Signed)
 " [facility]    CSN: [id number] Arrival date & time: 05/18/24  1752      History   Chief Complaint Chief Complaint  Patient presents with   Sore Throat   Headache   Nasal Congestion   Cough    HPI Destiny Skinner is a 38 y.o. female.   Pt presents today due to 2-3 days of nasal drainage, fatigue, throat pain, and headache. Pt states that she has been using cough drops and warm tea for symptoms without relief. Pt states that she has sick contacts at home and at work.   The history is provided by the patient.  Sore Throat Associated symptoms include headaches.  Headache Associated symptoms: cough   Cough Associated symptoms: headaches     Past Medical History:  Diagnosis Date   Seasonal allergies     Patient Active Problem List   Diagnosis Date Noted   Nasal congestion 07/25/2023   Acute non-recurrent maxillary sinusitis 07/25/2023   Encounter for screening for metabolic disorder 06/03/2023   Screening for viral disease 06/03/2023   Screening for cervical cancer 06/03/2023   Rash and nonspecific skin eruption 06/03/2023   Bronchitis 05/11/2023   Obesity, unspecified 05/11/2013    Past Surgical History:  Procedure Laterality Date   FOOT SURGERY  2022   TONSILLECTOMY  05/25/1991   WISDOM TOOTH EXTRACTION      OB History   No obstetric history on file.      Home Medications    Prior to Admission medications  Medication Sig Start Date End Date Taking? Authorizing Provider  cetirizine (ZYRTEC) 10 MG tablet Take 10 mg by mouth daily.   Yes [provider]  Multiple Vitamin (MULTIVITAMIN) capsule Take 1 capsule by mouth daily.   Yes [provider]  oseltamivir  (TAMIFLU ) 75 MG capsule Take 1 capsule (75 mg total) by mouth every 12 (twelve) hours. 05/18/24  Yes Andra Krabbe C, PA-C  augmented betamethasone  dipropionate (DIPROLENE -AF) 0.05 % cream Apply topically. Patient not taking: Reported on 05/18/2024 07/08/23    [provider]  doxycycline (VIBRAMYCIN) 100 MG capsule Take by mouth 2 (two) times daily. Patient not taking: Reported on 05/18/2024 07/08/23   [provider]  HYDROcodone  bit-homatropine (HYCODAN) 5-1.5 MG/5ML syrup Take 5 mLs by mouth every 6 (six) hours as needed for cough. Patient not taking: Reported on 05/18/2024 12/28/23   Rolinda Rogue, MD  metroNIDAZOLE (METROGEL) 0.75 % gel Apply topically. Patient not taking: Reported on 05/18/2024 07/08/23   [provider]    Family History Family History  Problem Relation Age of Onset   Diabetes Mother    Hyperlipidemia Mother    Hypertension Mother    Hyperlipidemia Father    Hypertension Father    Heart disease Maternal Grandmother    Heart disease Maternal Grandfather    Diabetes Paternal Grandmother    Cancer Paternal Grandfather     Social History Social History[1]   Allergies   Patient has no known allergies.   Review of Systems Review of Systems  Respiratory:  Positive for cough.   Neurological:  Positive for headaches.     Physical Exam Triage Vital Signs ED Triage Vitals  Encounter Vitals Group     BP 05/18/24 1825 132/75     Girls Systolic BP Percentile --      Girls Diastolic BP Percentile --      Boys Systolic BP Percentile --      Boys Diastolic BP Percentile --  Pulse Rate 05/18/24 1825 (!) 103     Resp 05/18/24 1825 14     Temp 05/18/24 1825 100.3 F (37.9 C)     Temp Source 05/18/24 1825 Oral     SpO2 05/18/24 1825 97 %     Weight --      Height --      Head Circumference --      Peak Flow --      Pain Score 05/18/24 1823 7     Pain Loc --      Pain Education --      Exclude from Growth Chart --    No data found.  Updated Vital Signs BP 132/75 (BP Location: Left Arm)   Pulse (!) 103   Temp 100.3 F (37.9 C) (Oral)   Resp 14   LMP 05/15/2024   SpO2 97%   Visual Acuity Right Eye Distance:   Left Eye Distance:   Bilateral Distance:    Right Eye  Near:   Left Eye Near:    Bilateral Near:     Physical Exam Vitals and nursing note reviewed.  Constitutional:      General: She is not in acute distress.    Appearance: Normal appearance. She is ill-appearing. She is not toxic-appearing or diaphoretic.  HENT:     Nose: Congestion (mildly enlarged turbinates) present. No rhinorrhea.     Mouth/Throat:     Mouth: Mucous membranes are moist.     Pharynx: Oropharynx is clear. No oropharyngeal exudate or posterior oropharyngeal erythema.  Eyes:     General: No scleral icterus. Cardiovascular:     Rate and Rhythm: Normal rate and regular rhythm.     Heart sounds: Normal heart sounds.  Pulmonary:     Effort: Pulmonary effort is normal. No respiratory distress.     Breath sounds: Normal breath sounds. No wheezing or rhonchi.  Skin:    General: Skin is warm.  Neurological:     Mental Status: She is alert and oriented to person, place, and time.  Psychiatric:        Mood and Affect: Mood normal.        Behavior: Behavior normal.      UC Treatments / Results  Labs (all labs ordered are listed, but only abnormal results are displayed) Labs Reviewed - No data to display  EKG   Radiology No results found.  Procedures Procedures (including critical care time)  Medications Ordered in UC Medications - No data to display  Initial Impression / Assessment and Plan / UC Course  I have reviewed the triage vital signs and the nursing notes.  Pertinent labs & imaging results that were available during my care of the patient were reviewed by me and considered in my medical decision making (see chart for details).    Final Clinical Impressions(s) / UC Diagnoses   Final diagnoses:  Influenza A     Discharge Instructions      You been diagnosed with a viral illness today. -Viruses have to run their course and medicines that are prescribed are meant to help with symptoms. - With viruses usually feel poorly from 3 to 7 days with  cough being the last symptoms to resolve.  -Cough can linger from days to weeks.  Antibiotics are not effective for viruses. -If your cough lasts more than 2 weeks and you are coughing so hard that you are vomiting or feel like you could pass out we need to follow-up with PCP for  further testing and evaluation. -Rest, increase water intake, may use pseudoephedrine for nasal congestion, Delsym (dextromethorphan) or honey as needed for cough, and ibuprofen  and/or Tylenol  as directed on packaging for pain and fever. -If you have hypertension you should take Coricidin or other OTC meds approved for people with high blood pressure. -You may use a spoonful of honey every 4-6 hours as needed for throat pain and cough. -Warm tea with honey and lemon are helpful for soothe throat as well.  Chloraseptic and Cepacol make a throat lozenge with numbing medication, can be purchased over-the-counter. -May also use Flonase  or sinus rinse for sinus pressure or nasal congestion.  Be sure to use distilled bottled water for sinus rinses. -May use coolmist humidifier to open up nasal passages -May elevate head to assist with postnasal drainage. -If you feel poorly (fever, fatigue, shortness of breath, nausea, etc.) for more than 10 days to be sure to follow-up with PCP or in clinic for further evaluation and additional treatments. If you experience chest pain with shortness of breath or pulse oxygen less than 95% you should report to the ER.    ED Prescriptions     Medication Sig Dispense Auth. Provider   oseltamivir  (TAMIFLU ) 75 MG capsule Take 1 capsule (75 mg total) by mouth every 12 (twelve) hours. 10 capsule Andra Corean BROCKS, PA-C      PDMP not reviewed this encounter.    [1]  Social History Tobacco Use   Smoking status: Never    Passive exposure: Current   Smokeless tobacco: Never  Vaping Use   Vaping status: Never Used  Substance Use Topics   Alcohol use: No    Alcohol/week: 0.0 standard  drinks of alcohol   Drug use: No     Andra Corean BROCKS DEVONNA 05/18/24 1902  "

## 2024-05-18 NOTE — ED Triage Notes (Signed)
 Pt reports sore throat, headaches, nasal congestion, and productive cough x3 days. Has been checking for temps but highest seen was 99.1. No med use but using hot teas to help with throat as well as cough drops. Pt reports her husband was seen at UC last week and tested positive for strep. Several sick coworkers as well.

## 2024-05-18 NOTE — Discharge Instructions (Signed)

## 2024-06-01 ENCOUNTER — Ambulatory Visit
Admission: RE | Admit: 2024-06-01 | Discharge: 2024-06-01 | Disposition: A | Source: Ambulatory Visit | Attending: Family Medicine | Admitting: Family Medicine

## 2024-06-01 DIAGNOSIS — Z1231 Encounter for screening mammogram for malignant neoplasm of breast: Secondary | ICD-10-CM

## 2024-06-06 ENCOUNTER — Ambulatory Visit: Payer: Self-pay | Admitting: Family Medicine

## 2024-06-08 ENCOUNTER — Encounter: Payer: BC Managed Care – PPO | Admitting: Family Medicine

## 2024-06-29 ENCOUNTER — Encounter: Payer: Self-pay | Admitting: Internal Medicine

## 2024-06-29 ENCOUNTER — Ambulatory Visit: Admitting: Internal Medicine

## 2024-06-29 VITALS — BP 128/80 | HR 72 | Temp 98.7°F | Ht 67.0 in | Wt 270.0 lb

## 2024-06-29 DIAGNOSIS — Z23 Encounter for immunization: Secondary | ICD-10-CM

## 2024-06-29 DIAGNOSIS — R0981 Nasal congestion: Secondary | ICD-10-CM

## 2024-06-29 NOTE — Progress Notes (Signed)
 " [facility] [address] [place] [address] Dept: [phone] Dept Fax: [phone]  New Patient Office Visit  Subjective:   Destiny Skinner Apr 23, 1986 06/29/2024  Chief Complaint  Patient presents with   Establish Care    Congestion, spots on face     HPI: Destiny Skinner presents today to TRANSFER care at [facility]. Introduced to publishing rights manager role and practice setting.  All questions answered.  Concerns: See below   Discussed the use of AI scribe software for clinical note transcription with the patient, who gave verbal consent to proceed.  History of Present Illness   Destiny Skinner is a 39 year old female who presents with nasal congestion and a runny nose.  She has been experiencing nasal congestion and rhinorrhea for about one and a half to two weeks. Initially, her rhinorrhea was profuse, but she has been taking an off-brand Tylenol  sinus medication, which has helped alleviate some symptoms. She attributes some of her symptoms to the dry air and environmental factors at her workplace, where many people seem to stay sick. She experiences occasional cough and a sensation of fullness in her ears, which pop when she blows her nose. Her congestion has improved, and she feels significantly better.  She takes Zyrtec daily for her sinus issues and has tried Benadryl at night, which helps her sleep.   The following portions of the patient's history were reviewed and updated as appropriate: past medical history, past surgical history, family history, social history, allergies, medications, and problem list.   Patient Active Problem List   Diagnosis Date Noted   Nasal congestion 07/25/2023   Rash and nonspecific skin eruption 06/03/2023   Obesity, unspecified 05/11/2013   Past Medical History:  Diagnosis Date   Seasonal allergies    Past Surgical History:  Procedure Laterality  Date   FOOT SURGERY  2022   TONSILLECTOMY  05/25/1991   WISDOM TOOTH EXTRACTION     Family History  Problem Relation Age of Onset   Diabetes Mother    Hyperlipidemia Mother    Hypertension Mother    Hyperlipidemia Father    Hypertension Father    Heart disease Maternal Grandmother    Heart disease Maternal Grandfather    Diabetes Paternal Grandmother    Cancer Paternal Grandfather    Current Medications[1] Allergies[2]  ROS: A complete ROS was performed with pertinent positives/negatives noted in the HPI. The remainder of the ROS are negative.   Objective:   Today's Vitals   06/29/24 0907  BP: 128/80  Pulse: 72  Temp: 98.7 F (37.1 C)  TempSrc: Temporal  SpO2: 99%  Weight: 270 lb (122.5 kg)  Height: 5' 7 (1.702 m)    GENERAL: Well-appearing, in NAD. Well nourished.  SKIN: Pink, warm and dry. No rash, lesion, ulceration, or ecchymoses.  HEENT:    HEAD: Normocephalic, non-traumatic.  EYES: Conjunctive pink without exudate. PERRL, EOMI.  EARS: External ear w/o redness, swelling, masses, or lesions. EAC clear. TM's intact, translucent w/o bulging, appropriate landmarks visualized.  NOSE: Septum midline w/o deformity. Nares patent, mucosa pink and non-inflamed w/o drainage. No sinus tenderness.  THROAT: Uvula midline. Oropharynx clear. Tonsils non-inflamed w/o exudate. Mucus membranes pink and moist.  NECK: Trachea midline. Full ROM w/o pain or tenderness. No lymphadenopathy.  RESPIRATORY: Chest wall symmetrical. Respirations even and non-labored. Breath sounds clear to auscultation bilaterally.  CARDIAC: S1, S2 present, regular rate and rhythm. Peripheral pulses 2+ bilaterally.  EXTREMITIES: Without  clubbing, cyanosis, or edema.  NEUROLOGIC: No motor or sensory deficits. Steady, even gait.  PSYCH/MENTAL STATUS: Alert, oriented x 3. Cooperative, appropriate mood and affect.   Health Maintenance Due  Topic Date Due   Hepatitis B Vaccines 19-59 Average Risk (1 of 3 -  19+ 3-dose series) Never done   DTaP/Tdap/Td (2 - Td or Tdap) 09/18/2023   Influenza Vaccine  Never done    No results found for any visits on 06/29/24.  Assessment & Plan:  Assessment and Plan    Nasal congestion Symptoms likely due to environmental factors. Improved with OTC medications and doxycycline. - Continue Zyrtec daily. - If symptoms worsen, use Zyrtec D for 3-5 days, then return to regular Zyrtec. - Consider Benadryl at night for sleep if needed.  General Health Maintenance Routine health maintenance discussed. Flu shot administered today. - Administered flu shot today. - Schedule mammogram for next year. - Plan for Pap smear co-testing every five years.        Orders Placed This Encounter  Procedures   Flu vaccine trivalent PF, 6mos and older(Flulaval,Afluria,Fluarix,Fluzone)   No orders of the defined types were placed in this encounter.   Return in about 3 months (around 09/26/2024) for Annual Physical Exam with fasting lab work.   Rosina Senters, FNP     [1]  Current Outpatient Medications:    cetirizine (ZYRTEC) 10 MG tablet, Take 10 mg by mouth daily., Disp: , Rfl:    Multiple Vitamin (MULTIVITAMIN) capsule, Take 1 capsule by mouth daily., Disp: , Rfl:    augmented betamethasone  dipropionate (DIPROLENE -AF) 0.05 % cream, Apply topically. (Patient not taking: Reported on 06/29/2024), Disp: , Rfl:  [2] No Known Allergies  "

## 2024-06-29 NOTE — Patient Instructions (Signed)
 Can try Zyrtec -D for nasal congestion if it worsens. Can take it for 3-5 days at a night

## 2024-09-28 ENCOUNTER — Encounter: Admitting: Internal Medicine
